# Patient Record
Sex: Male | Born: 1990 | Race: White | Hispanic: Yes | Marital: Single | Smoking: Current every day smoker
Health system: Southern US, Community
[De-identification: ages and names within clinical notes are randomized; demographics above are authoritative.]

---
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
## 2015-09-23 ENCOUNTER — Emergency Department (HOSPITAL_COMMUNITY)
Admission: EM | Admit: 2015-09-23 | Discharge: 2015-09-23 | Disposition: A | Discharge status: Home / Self Care | Payer: Self-pay | Attending: Emergency Medicine | Admitting: Emergency Medicine

## 2015-09-23 ENCOUNTER — Encounter (HOSPITAL_COMMUNITY): Payer: Self-pay | Admitting: *Deleted

## 2015-09-23 ENCOUNTER — Emergency Department (HOSPITAL_COMMUNITY): Payer: Self-pay

## 2015-09-23 DIAGNOSIS — Y998 Other external cause status: Secondary | ICD-10-CM | POA: Insufficient documentation

## 2015-09-23 DIAGNOSIS — Y9366 Activity, soccer: Secondary | ICD-10-CM | POA: Insufficient documentation

## 2015-09-23 DIAGNOSIS — S8991XA Unspecified injury of right lower leg, initial encounter: Secondary | ICD-10-CM | POA: Insufficient documentation

## 2015-09-23 DIAGNOSIS — M25561 Pain in right knee: Secondary | ICD-10-CM

## 2015-09-23 DIAGNOSIS — F17200 Nicotine dependence, unspecified, uncomplicated: Secondary | ICD-10-CM | POA: Insufficient documentation

## 2015-09-23 DIAGNOSIS — Y92322 Soccer field as the place of occurrence of the external cause: Secondary | ICD-10-CM | POA: Insufficient documentation

## 2015-09-23 DIAGNOSIS — X501XXA Overexertion from prolonged static or awkward postures, initial encounter: Secondary | ICD-10-CM | POA: Insufficient documentation

## 2015-09-23 MED ORDER — NAPROXEN 500 MG PO TABS: 500.0000 mg | ORAL_TABLET | Freq: Two times a day (BID) | ORAL | Status: DC

## 2015-09-23 NOTE — Progress Notes (Signed)
Orthopedic Tech Progress Note Patient Details:  Bradley SladeJuan Underwood 04/12/1991 [id number]  Ortho Devices Type of Ortho Device: Crutches Ortho Device/Splint Interventions: Application   Kaysee Hergert 09/23/2015, 8:01 AM

## 2015-09-23 NOTE — ED Notes (Signed)
Pt c/o R knee pain onset yesterday after twisting leg when playing soccer. Pt has knee brace in place

## 2015-09-23 NOTE — Discharge Instructions (Signed)
You have been seen today for knee pain. Your imaging showed no abnormalities. Follow up with orthopedics as soon as possible for reevaluation and chronic management. Follow up with PCP as needed. Return to ED should symptoms worsen.   RESOURCE GUIDE  Chronic Pain Problems: Contact [facility]  [phone] Patients need to be referred by their primary care doctor.  Insufficient Money for Medicine: Contact United Way:  call "211" or [facility] 3213188840.  No Primary Care Doctor: - Call [facility]  [phone] - can help you locate a primary care doctor that  accepts your insurance, provides certain services, etc. - Physician Referral Service- [phone]  Agencies that provide inexpensive medical care: - [facility]  [phone] - [facility]  [phone] - [facility]  [phone] - Women's Clinic  [phone] - Planned Parenthood  [phone] [facility]  [phone]  Medicaid-accepting [facility] Providers: - [facility]- [address]  [phone], Mon-Fri 9am-7pm, Sat 9am-1pm - [facility]- [address] [place], [address] [place]  [phone] - [facility]- [address], [address] [place]  [phone] [facility]- [address]  [phone] - Renaye Rakers- [address] [place], [address], [phone]  Only accepts [place] patients after they have their name  applied to their card  Self Pay (no insurance) in [place]: - Sickle Cell Patients: Dr Willey Blade, [facility]  [address] [place], [phone] - [facility]- [address] [place]  [phone]       [facility] [place]- [address]       -     [facility]- see information above (Speak to [employer] if you do not have insurance)       -  [facility]- [address] [place], [phone]        -  [facility]- [address],  [phone]       -  [facility]- [address] [phone]       -  Dr Julio Sicks-  [address], [place], [phone]       -  [facility]- [address] [phone]       -  [facility]- [address] [phone], also [address], [phone]       -    [facility]- [address] [place], [phone], 1st & 3rd Saturday   every month, 10am-1pm  1) Find a Doctor and Pay Out of Pocket Although you won't have to find out who is covered by your insurance plan, it is a good idea to ask around and get recommendations. You will then need to call the office and see if the doctor you have chosen will accept you as a new patient and what types of options they offer for patients who are self-pay. Some doctors offer discounts or will set up payment plans for their patients who do not have insurance, but you will need to ask so you aren't surprised when you get to your appointment.  2) Contact Your Local Health Department Not all health departments have doctors that can see patients for sick visits, but many do, so it is worth a call to see  if yours does. If you don't know where your local health department is, you can check in your phone book. The CDC also has a tool to help you locate your state's health department, and many state websites also have listings of all of their local health departments.  3) Find a Walk-in Clinic If your illness is not likely to be very severe or complicated, you may want to try a walk in clinic. These are popping up all over the country in pharmacies, drugstores, and shopping centers. They're usually staffed by nurse practitioners or physician assistants that have been trained to treat common illnesses and complaints. They're usually fairly quick and inexpensive. However, if you have serious medical issues or chronic medical problems, these are probably not your  best option  STD Testing - [facility] [place], STD Clinic, [address], [place], phone [phone] or [phone].  Monday - Friday, call for an appointment. [facility]- [place] [facility] of [facility] [place], STD Clinic, [place], [place], phone [phone] or [phone].  Monday - Friday, call for an appointment.  Abuse/Neglect: [facility] [place] Child Abuse Hotline 931-699-6910(336) [phone] [facility] [place] Child Abuse Hotline 325-800-5648(320)471-1137 (After Hours)  Emergency Shelter:  [facility] [phone](336) 6627278902  Maternity Homes: - [facility] [phone](336) [phone] - [facility] (343)[phone](704) [phone]  MRSA Hotline #:   (514) 181-8772(925) 746-7596  [facility] Resources  [facility]  United Way [facility]. [address]                 [address]         [address]  [place]                                               [place]                              [place] Phone:  [phone]                                  Phone:  [phone]                   Phone:  [phone]  [facility], [phone] - [facility] Services - [phone]- [phone]       -     [facility] in [place], [address],                                  [phone], [facility]  [place] Child Abuse Hotline 3672878346(336) (620)696-0158 or 4753478157(336) 8151158193 (After Hours)   Behavioral Health Services  Substance Abuse Resources: - [facility] Drug Services  (848)384-6811209-127-6836 - [facility] [phone] - [facility] (318)[phone])[phone] Floydene Flock- [facility] [phone] - Residential & Outpatient Substance Abuse Program  [phone]  Psychological Services: Tressie Ellis- [facility]  [phone] - [facility]  [phone] - [facility], [phone] [place] [address], [place], ACCESS LINE: (816) [phone] or  (216) [phone], [email or web address]  Dental Assistance  If unable to pay or  uninsured, contact:  [facility] or [facility]. to become qualified for the adult dental clinic.  Patients with Medicaid: [facility] [phone] [address], [phone] [address], [phone]  If unable to pay, or uninsured, contact [facility] (602)490-6067) or [facility] Department [phone]) [phone] in [place], [phone] in [facility]) to become qualified for the adult dental clinic   Other Low-Cost Community Dental Services: - [facility]- [address] [place], [place], [place], [address], [phone], [address]. [address], 2nd and 4th Thursday of the month at 6:30am.  10 clients each day by appointment, can sometimes see walk-in patients if someone does not show for an appointment. [facility]- [address] [place], [place], [address], [phone] - [facility]- [address] [place], [place], [address], [phone] - [facility]- [phone] [facility] Health Department- [phone] [facility] Department- [phone]

## 2015-09-23 NOTE — ED Provider Notes (Signed)
CSN: [id number]     Arrival date & time 09/23/15  [id number] History   First MD Initiated Contact with Patient 09/23/15 [phone]     Chief Complaint  Patient presents with  . Knee Pain     (Consider location/radiation/quality/duration/timing/severity/associated sxs/prior Treatment) HPI   Bradley Underwood is a 25 y.o. male, patient with no pertinent past medical history, presenting to the ED with right knee pain that began yesterday. Pt states he planted his foot and pivoted during a soccer game. Pt states he "may have felt a pop." Pt is able to bear weight on the knee. Pt rates his pain at 8/10, describes it as "just uncomfortable," nonradiating. Pt denies falls or head trauma. Further denies neuro deficits or any other complaints.    History reviewed. No pertinent past medical history. History reviewed. No pertinent past surgical history. No family history on file. Social History  Substance Use Topics  . Smoking status: Current Every Day Smoker  . Smokeless tobacco: None  . Alcohol Use: No    Review of Systems  Musculoskeletal: Positive for arthralgias (right knee).  Skin: Negative for color change and pallor.  Neurological: Negative for weakness and numbness.      Allergies  Review of patient's allergies indicates no known allergies.  Home Medications   Prior to Admission medications   Medication Sig Start Date End Date Taking? Authorizing Provider  naproxen (NAPROSYN) 500 MG tablet Take 1 tablet (500 mg total) by mouth 2 (two) times daily. 09/23/15   Thurlow Gallaga C Lawsyn Heiler, PA-C   BP 151/88 mmHg  Pulse 78  Temp(Src) 97.9 F (36.6 C) (Oral)  Resp 16  SpO2 100% Physical Exam  Constitutional: He appears well-developed and well-nourished. No distress.  HENT:  Head: Normocephalic and atraumatic.  Eyes: Conjunctivae are normal.  Cardiovascular: Normal rate and regular rhythm.   Pulmonary/Chest: Effort normal.  Musculoskeletal:  Tenderness to anterior right knee. No swelling, wounds,  erythema, effusion, laxity, or any other abnormalities. ROM intact.  Neurological: He is alert. He has normal reflexes.  No sensory deficits. Strength 5/5.  Skin: Skin is warm and dry. He is not diaphoretic.  Nursing note and vitals reviewed.   ED Course  Procedures (including critical care time)  Imaging Review Dg Knee Complete 4 Views Right  09/23/2015  CLINICAL DATA:  Twisting injury while playing soccer EXAM: RIGHT KNEE - COMPLETE 4+ VIEW COMPARISON:  None. FINDINGS: Frontal, lateral, and bilateral oblique views were obtained. There is no fracture or dislocation. No joint effusion. The joint spaces appear normal. No erosive change. IMPRESSION: No fracture or joint effusion.  No appreciable arthropathy. Electronically Signed   By: Bretta BangWilliam  Woodruff III M.D.   On: 09/23/2015 07:14   I have personally reviewed and evaluated these images as part of my medical decision-making.   EKG Interpretation None      MDM   Final diagnoses:  Knee pain, acute, right    Bradley Underwood presents with right knee pain from an injury that occurred yesterday.  No abnormality found on the exam that would suggest more serious injury. No neuro deficits. X-ray shows no acute abnormality. Pt already has an adequate knee brace from an injury to his left knee. Given crutches and prescription for naproxen. Advised to follow-up with orthopedics. Return precautions discussed. Patient voiced understanding of these instructions, accepts the plan, and is comfortable with discharge.   Filed Vitals:   09/23/15 0647  BP: 151/88  Pulse: 78  Temp: 97.9 F (36.6 C)  TempSrc:  Oral  Resp: 16  SpO2: 100%     Anselm Pancoast, PA-C 09/24/15 1704  Alvira Monday, MD 09/27/15 1107

## 2015-10-01 ENCOUNTER — Emergency Department (HOSPITAL_COMMUNITY)
Admission: EM | Admit: 2015-10-01 | Discharge: 2015-10-01 | Disposition: A | Discharge status: Home / Self Care | Payer: Medicaid Other | Attending: Emergency Medicine | Admitting: Emergency Medicine

## 2015-10-01 ENCOUNTER — Encounter (HOSPITAL_COMMUNITY): Payer: Self-pay | Admitting: Emergency Medicine

## 2015-10-01 DIAGNOSIS — S8391XD Sprain of unspecified site of right knee, subsequent encounter: Secondary | ICD-10-CM | POA: Insufficient documentation

## 2015-10-01 DIAGNOSIS — Z791 Long term (current) use of non-steroidal anti-inflammatories (NSAID): Secondary | ICD-10-CM | POA: Insufficient documentation

## 2015-10-01 DIAGNOSIS — F17200 Nicotine dependence, unspecified, uncomplicated: Secondary | ICD-10-CM | POA: Insufficient documentation

## 2015-10-01 DIAGNOSIS — X509XXD Other and unspecified overexertion or strenuous movements or postures, subsequent encounter: Secondary | ICD-10-CM | POA: Insufficient documentation

## 2015-10-01 NOTE — Discharge Instructions (Signed)

## 2015-10-01 NOTE — ED Provider Notes (Signed)
CSN: [id number]     Arrival date & time 10/01/15  0031 History   First MD Initiated Contact with Patient 10/01/15 [phone]     Chief Complaint  Patient presents with  . Knee Pain     Patient is a 25 y.o. male presenting with knee pain. The history is provided by the patient. No language interpreter was used.  Knee Pain  Bradley Underwood is a 25 y.o. male who presents to the Emergency Department complaining of knee pain.  He reports 1 week of right knee pain following injuring it while playing soccer. He states he had a twisting injury on a planted foot express immediate pain. He has pain around the entire knee on the anterior aspect. Pain is worse with standing for long periods of time at work and he has to rest every 35-40 minutes. He has some local swelling. He is able to range the knee. Symptoms are mild and constant.  History reviewed. No pertinent past medical history. History reviewed. No pertinent past surgical history. No family history on file. Social History  Substance Use Topics  . Smoking status: Current Every Day Smoker  . Smokeless tobacco: None  . Alcohol Use: No    Review of Systems  All other systems reviewed and are negative.     Allergies  Review of patient's allergies indicates no known allergies.  Home Medications   Prior to Admission medications   Medication Sig Start Date End Date Taking? Authorizing Provider  naproxen (NAPROSYN) 500 MG tablet Take 1 tablet (500 mg total) by mouth 2 (two) times daily. 09/23/15   Shawn C Joy, PA-C   BP 125/71 mmHg  Pulse 75  Temp(Src) 98 F (36.7 C) (Oral)  Resp 18  SpO2 99% Physical Exam  Constitutional: He is oriented to person, place, and time. He appears well-developed and well-nourished. No distress.  HENT:  Head: Normocephalic and atraumatic.  Cardiovascular: Normal rate.   Pulmonary/Chest: Effort normal. No respiratory distress.  Musculoskeletal:  There is mild soft tissue swelling about the right knee. No  ligamentous laxity on exam. Mild diffuse tenderness to palpation around the right knee. 2+ DP pulses. FROM present in the knee.    Neurological: He is alert and oriented to person, place, and time.  5 out of 5 bilateral lower extremity strength  Skin: Skin is warm and dry.  Psychiatric: He has a normal mood and affect. His behavior is normal.  Nursing note and vitals reviewed.   ED Course  Procedures (including critical care time) Labs Review Labs Reviewed - No data to display  Imaging Review No results found. I have personally reviewed and evaluated these images and lab results as part of my medical decision-making.   EKG Interpretation None      MDM   Final diagnoses:  Knee sprain, right, subsequent encounter  Patient here for evaluation of right knee pain following an injury 1 week previously. He had imaging performed that time that was reviewed on this visit. No evidence of acute fracture or dislocation. Discussed with patient and care for knee sprain. Discussed using his knee sleeve that he is at the bedside for pain control as well as anti-inflammatories and rest. Referred to orthopedics for further evaluation of this knee pain.  Tilden FossaElizabeth Dori Devino, MD 10/01/15 2241

## 2015-10-01 NOTE — ED Notes (Signed)
Pt. reports persistent right knee pain onset last week injured while playing soccer , seen here last Monday x-ray done discharged home with prescription Naproxen with no relief.

## 2016-08-26 ENCOUNTER — Emergency Department (HOSPITAL_COMMUNITY)
Admission: EM | Admit: 2016-08-26 | Discharge: 2016-08-26 | Disposition: A | Discharge status: Home / Self Care | Payer: Medicaid Other | Attending: Emergency Medicine | Admitting: Emergency Medicine

## 2016-08-26 ENCOUNTER — Encounter (HOSPITAL_COMMUNITY): Payer: Self-pay | Admitting: *Deleted

## 2016-08-26 DIAGNOSIS — J029 Acute pharyngitis, unspecified: Secondary | ICD-10-CM

## 2016-08-26 DIAGNOSIS — F17200 Nicotine dependence, unspecified, uncomplicated: Secondary | ICD-10-CM | POA: Insufficient documentation

## 2016-08-26 DIAGNOSIS — K122 Cellulitis and abscess of mouth: Secondary | ICD-10-CM | POA: Insufficient documentation

## 2016-08-26 LAB — RAPID STREP SCREEN (MED CTR MEBANE ONLY): Streptococcus, Group A Screen (Direct): NEGATIVE

## 2016-08-26 MED ORDER — OXYCODONE-ACETAMINOPHEN 5-325 MG PO TABS
1.0000 | ORAL_TABLET | Freq: Once | ORAL | Status: AC
  Administered 2016-08-26: 1 via ORAL
  Filled 2016-08-26: qty 1

## 2016-08-26 MED ORDER — DEXAMETHASONE 4 MG PO TABS
12.0000 mg | ORAL_TABLET | Freq: Once | ORAL | Status: AC
  Administered 2016-08-26: 12 mg via ORAL
  Filled 2016-08-26: qty 3

## 2016-08-26 MED ORDER — KETOROLAC TROMETHAMINE 15 MG/ML IJ SOLN
30.0000 mg | Freq: Once | INTRAMUSCULAR | Status: AC
  Administered 2016-08-26: 30 mg via INTRAMUSCULAR
  Filled 2016-08-26: qty 2

## 2016-08-26 NOTE — ED Triage Notes (Signed)
The pt is c/o a sore throat for 2 days he thinks he has had a temp

## 2016-08-26 NOTE — ED Provider Notes (Signed)
[facility]PT Provider Note    By signing my name below, I, Rosario Adie, attest that this documentation has been prepared under the direction and in the presence of Raeford Razor, MD. Electronically Signed: Rosario Adie, ED Scribe. 08/26/16. 7:46 PM.  History   Chief Complaint Chief Complaint  Patient presents with  . Sore Throat   The history is provided by the patient and medical records. No language interpreter was used.    HPI Comments: Bradley Underwood is a 26 y.o. male with no pertinent PMHx, who presents to the Emergency Department complaining of persistent, worsening sore throat beginning two days ago. He notes associated fever, left-sided ear pain, and dysphagia secondary to his sore throat. Pt is able to tolerate their own secretions, but pain is exacerbated w/ swallowing. He states that he has been taking Mucinex at home with temporary relief of his fever, but not his sore throat. No recent prolonged fume inhalation/exposure. Pt denies cough, or any other associated symptoms.   History reviewed. No pertinent past medical history.  There are no active problems to display for this patient.  History reviewed. No pertinent surgical history.  Home Medications    Prior to Admission medications   Medication Sig Start Date End Date Taking? Authorizing Provider  naproxen (NAPROSYN) 500 MG tablet Take 1 tablet (500 mg total) by mouth 2 (two) times daily. 09/23/15   Anselm Pancoast, PA-C   Family History No family history on file.  Social History Social History  Substance Use Topics  . Smoking status: Current Every Day Smoker  . Smokeless tobacco: Never Used  . Alcohol use No   Allergies   Patient has no known allergies.  Review of Systems Review of Systems  Constitutional: Positive for fever.  HENT: Positive for ear pain (left) and sore throat.        Positive for dysphagia.   Respiratory: Negative for cough.   All other systems reviewed and are  negative.  Physical Exam Updated Vital Signs BP (!) 143/87 (BP Location: Right Arm)   Pulse (!) 107   Temp 99.5 F (37.5 C) (Oral)   Resp 18   SpO2 99%   Physical Exam  Constitutional: He is oriented to person, place, and time. He appears well-developed and well-nourished.  HENT:  Head: Normocephalic and atraumatic.  Right Ear: External ear normal.  Left Ear: External ear normal.  Nose: Nose normal.  Pharyngitis is noted. Uvula is enlarged and edematous, but appears midline. Normal sounding voice. Neck supple. TMs are normal bilaterally.   Eyes: Conjunctivae are normal. Right eye exhibits no discharge. Left eye exhibits no discharge.  Neck: Normal range of motion.  Cardiovascular: Normal rate, regular rhythm and normal heart sounds.   No murmur heard. Pulmonary/Chest: Effort normal and breath sounds normal. No respiratory distress. He has no wheezes. He has no rales.  Abdominal: Soft. There is no tenderness. There is no rebound and no guarding.  Musculoskeletal: Normal range of motion. He exhibits no edema or tenderness.  Neurological: He is alert and oriented to person, place, and time. No cranial nerve deficit. Coordination normal.  Skin: Skin is warm and dry. No rash noted. No erythema. No pallor.  Psychiatric: He has a normal mood and affect. His behavior is normal.  Nursing note and vitals reviewed.  ED Treatments / Results  DIAGNOSTIC STUDIES: Oxygen Saturation is 99% on RA, normal by my interpretation.   COORDINATION OF CARE: 7:46 PM-Discussed next steps with pt. Pt verbalized understanding  and is agreeable with the plan.   Labs (all labs ordered are listed, but only abnormal results are displayed) Labs Reviewed  RAPID STREP SCREEN (NOT AT [facility])  CULTURE, GROUP A STREP [facility])   EKG  EKG Interpretation None      Radiology No results found.  Procedures Procedures   Medications Ordered in ED Medications - No data to display  Initial Impression /  Assessment and Plan / ED Course  I have reviewed the triage vital signs and the nursing notes.  Pertinent labs & imaging results that were available during my care of the patient were reviewed by me and considered in my medical decision making (see chart for details).   Final Clinical Impressions(s) / ED Diagnoses   Final diagnoses:  Pharyngitis, unspecified etiology  Uvulitis   New Prescriptions New Prescriptions   No medications on file   I personally preformed the services scribed in my presence. The recorded information has been reviewed is accurate. Raeford Razor, MD.        Raeford Razor, MD 09/03/16 [phone]

## 2016-08-27 ENCOUNTER — Emergency Department (HOSPITAL_COMMUNITY)
Admission: EM | Admit: 2016-08-27 | Discharge: 2016-08-27 | Disposition: A | Discharge status: Home / Self Care | Payer: Self-pay | Attending: Emergency Medicine | Admitting: Emergency Medicine

## 2016-08-27 ENCOUNTER — Emergency Department (HOSPITAL_COMMUNITY): Payer: Self-pay

## 2016-08-27 ENCOUNTER — Encounter (HOSPITAL_COMMUNITY): Payer: Self-pay | Admitting: *Deleted

## 2016-08-27 DIAGNOSIS — F17200 Nicotine dependence, unspecified, uncomplicated: Secondary | ICD-10-CM | POA: Insufficient documentation

## 2016-08-27 DIAGNOSIS — J36 Peritonsillar abscess: Secondary | ICD-10-CM | POA: Insufficient documentation

## 2016-08-27 LAB — CBC WITH DIFFERENTIAL/PLATELET
BASOS ABS: 0 10*3/uL (ref 0.0–0.1)
BASOS PCT: 0 %
EOS ABS: 0 10*3/uL (ref 0.0–0.7)
EOS PCT: 0 %
HCT: 42.1 % (ref 39.0–52.0)
HEMOGLOBIN: 15 g/dL (ref 13.0–17.0)
Lymphocytes Relative: 11 %
Lymphs Abs: 2.2 10*3/uL (ref 0.7–4.0)
MCH: 29.1 pg (ref 26.0–34.0)
MCHC: 35.6 g/dL (ref 30.0–36.0)
MCV: 81.6 fL (ref 78.0–100.0)
MONO ABS: 2.1 10*3/uL — AB (ref 0.1–1.0)
Monocytes Relative: 11 %
Neutro Abs: 16.1 10*3/uL — ABNORMAL HIGH (ref 1.7–7.7)
Neutrophils Relative %: 78 %
PLATELETS: 252 10*3/uL (ref 150–400)
RBC: 5.16 MIL/uL (ref 4.22–5.81)
RDW: 12.3 % (ref 11.5–15.5)
WBC: 20.4 10*3/uL — AB (ref 4.0–10.5)

## 2016-08-27 LAB — BASIC METABOLIC PANEL
ANION GAP: 11 (ref 5–15)
BUN: 14 mg/dL (ref 6–20)
CO2: 23 mmol/L (ref 22–32)
Calcium: 9.7 mg/dL (ref 8.9–10.3)
Chloride: 103 mmol/L (ref 101–111)
Creatinine, Ser: 0.73 mg/dL (ref 0.61–1.24)
GFR calc Af Amer: >60 mL/min (ref >60)
GLUCOSE: 98 mg/dL (ref 65–99)
POTASSIUM: 3.7 mmol/L (ref 3.5–5.1)
Sodium: 137 mmol/L (ref 135–145)

## 2016-08-27 LAB — MONONUCLEOSIS SCREEN: MONO SCREEN: NEGATIVE

## 2016-08-27 MED ORDER — MORPHINE SULFATE (PF) 4 MG/ML IV SOLN
4.0000 mg | Freq: Once | INTRAVENOUS | Status: AC
  Administered 2016-08-27: 4 mg via INTRAVENOUS
  Filled 2016-08-27: qty 1

## 2016-08-27 MED ORDER — CLINDAMYCIN PHOSPHATE 600 MG/50ML IV SOLN
600.0000 mg | Freq: Once | INTRAVENOUS | Status: AC
  Administered 2016-08-27: 600 mg via INTRAVENOUS
  Filled 2016-08-27: qty 50

## 2016-08-27 MED ORDER — HYDROCODONE-ACETAMINOPHEN 5-325 MG PO TABS
1.0000 | ORAL_TABLET | Freq: Once | ORAL | Status: AC
  Administered 2016-08-27: 1 via ORAL
  Filled 2016-08-27: qty 1

## 2016-08-27 MED ORDER — IOPAMIDOL (ISOVUE-300) INJECTION 61%
INTRAVENOUS | Status: AC
  Administered 2016-08-27: 75 mL
  Filled 2016-08-27: qty 75

## 2016-08-27 MED ORDER — CLINDAMYCIN HCL 300 MG PO CAPS: 300.0000 mg | ORAL_CAPSULE | Freq: Four times a day (QID) | ORAL | 0 refills | Status: DC

## 2016-08-27 MED ORDER — HYDROCODONE-ACETAMINOPHEN 5-325 MG PO TABS: 1.0000 | ORAL_TABLET | ORAL | 0 refills | Status: DC | PRN

## 2016-08-27 NOTE — ED Provider Notes (Signed)
[facility]EMERGENCY DEPT Provider Note   CSN: [id number] Arrival date & time: 08/27/16  1407  By signing my name below, I, Bradley Underwood, attest that this documentation has been prepared under the direction and in the presence of Bradley Pander, MD. Electronically Signed: Cynda Underwood, Scribe. 08/27/16. 2:39 PM.  History   Chief Complaint Chief Complaint  Patient presents with  . Sore Throat    HPI Comments: Bradley Underwood is a 26 y.o. male with no pertinent medical history, who presents to the Emergency Department complaining of a persistent, gradually worsening sore throat that began 3 days ago. Patient was seen here yesterday and rapid strep was negative and he received decadron and toradol, and he was noted to have mild bilateral tonsillar swelling. Patient reports progressively worsening left sided neck swelling. Patient reports associated increased tonsillar swelling, trouble swallowing, left ear pain radiating to the left sided face, subjective fever. Patient is able to tolerate secretions and liquids. Patient denies any nausea, vomiting, congestion, or any other symptoms.   The history is provided by the patient. No language interpreter was used.    History reviewed. No pertinent past medical history.  There are no active problems to display for this patient.   History reviewed. No pertinent surgical history.     Home Medications    Prior to Admission medications   Medication Sig Start Date End Date Taking? Authorizing Provider  naproxen (NAPROSYN) 500 MG tablet Take 1 tablet (500 mg total) by mouth 2 (two) times daily. 09/23/15   Anselm Pancoast, PA-C    Family History No family history on file.  Social History Social History  Substance Use Topics  . Smoking status: Current Every Day Smoker  . Smokeless tobacco: Never Used  . Alcohol use No     Allergies   Patient has no known allergies.   Review of Systems Review of Systems  Constitutional: Positive for  fever (subjective ). Negative for chills.  HENT: Positive for ear pain (left), sore throat and trouble swallowing. Negative for congestion and facial swelling.   Gastrointestinal: Negative for nausea and vomiting.  All other systems reviewed and are negative.    Physical Exam Updated Vital Signs BP (!) 143/88   Pulse 97   Temp 99.1 F (37.3 C) (Oral)   Resp 18   Ht  (1.676 m)   Wt 190 lb (86.2 kg)   SpO2 99%   BMI 30.67 kg/m   Physical Exam  Constitutional: He is oriented to person, place, and time. He appears well-developed and well-nourished.  HENT:  Head: Normocephalic.  Right Ear: External ear normal.  Left Ear: External ear normal.  Mouth/Throat: No oropharyngeal exudate.  Tonsillar swelling bilaterally, left greater than right. Uvula midline. Bilaterally effusions behind both ears. No obvious otitis media bilaterally.   Eyes: EOM are normal.  Neck: Normal range of motion. Neck supple. No tracheal deviation present.  L sided cervical LAD   Cardiovascular: Normal rate.   Pulmonary/Chest: Effort normal. No stridor.  Abdominal: He exhibits no distension.  Musculoskeletal: Normal range of motion.  Lymphadenopathy:    He has cervical adenopathy.  Neurological: He is alert and oriented to person, place, and time.  Psychiatric: He has a normal mood and affect.  Nursing note and vitals reviewed.    ED Treatments / Results  DIAGNOSTIC STUDIES: Oxygen Saturation is 98% on RA, normal by my interpretation.    COORDINATION OF CARE: 2:39 PM Discussed treatment plan with pt at bedside and pt  agreed to plan, which includes IV antibiotics, Mono test, and a CT.   Labs (all labs ordered are listed, but only abnormal results are displayed) Labs Reviewed  CBC WITH DIFFERENTIAL/PLATELET - Abnormal; Notable for the following:       Result Value   WBC 20.4 (*)    Neutro Abs 16.1 (*)    Monocytes Absolute 2.1 (*)    All other components within normal limits  BASIC  METABOLIC PANEL  MONONUCLEOSIS SCREEN    EKG  EKG Interpretation None       Radiology Ct Soft Tissue Neck W Contrast  Result Date: 08/27/2016 CLINICAL DATA:  LEFT neck pain and swelling with dysphagia for 2-3 days. Evaluate for peritonsillar abscess. EXAM: CT NECK WITH CONTRAST TECHNIQUE: Multidetector CT imaging of the neck was performed using the standard protocol following the bolus administration of intravenous contrast. CONTRAST:  75mL ISOVUE-300 IOPAMIDOL (ISOVUE-300) INJECTION 61% COMPARISON:  None. FINDINGS: PHARYNX AND LARYNX: Irregular 2.7 x 2 x 3 cm (transverse by AP by CC) rim enhancing fluid collection LEFT pharynx consistent with peritonsillar abscess with edema extending to the mucosal surface. Effacement LEFT parapharyngeal fat planes. Extensive surrounding edema extending to the LEFT supraglottic fat, LEFT glossopharyngeal fold, LEFT aryepiglottic fold. Effacement LEFT piriform sinus and LEFT vallecula. Airway is patent. Small retropharyngeal effusion. Normal larynx. SALIVARY GLANDS: Normal. THYROID: Normal. LYMPH NODES: No lymphadenopathy by CT size criteria. Scattered small reactive lymph nodes. VASCULAR: Normal. LIMITED INTRACRANIAL: Normal. VISUALIZED ORBITS: Normal. MASTOIDS AND VISUALIZED PARANASAL SINUSES: Trace paranasal sinus mucosal thickening. Mastoid air cells are well aerated. SKELETON: Nonacute. UPPER CHEST: Lung apices are clear. No superior mediastinal lymphadenopathy. OTHER: None. IMPRESSION: 2.7 x 2 x 3 cm LEFT peritonsillar abscess. Severe LEFT pharyngitis. Patent airway. Acute findings discussed with and reconfirmed by Dr.Jashawn Floyd on 08/27/2016 at 5:26 pm. Electronically Signed   By: Bradley Underwood M.D.   On: 08/27/2016 17:29    Procedures Procedures (including critical care time)  Medications Ordered in ED Medications  HYDROcodone-acetaminophen (NORCO/VICODIN) 5-325 MG per tablet 1 tablet (not administered)  clindamycin (CLEOCIN) IVPB 600 mg (0 mg  Intravenous Stopped 08/27/16 1547)  morphine 4 MG/ML injection 4 mg (4 mg Intravenous Given 08/27/16 1452)  iopamidol (ISOVUE-300) 61 % injection (75 mLs  Contrast Given 08/27/16 1659)     Initial Impression / Assessment and Plan / ED Course  I have reviewed the triage vital signs and the nursing notes.  Pertinent labs & imaging results that were available during my care of the patient were reviewed by me and considered in my medical decision making (see chart for details).     Gonzalo Waymire is a 26 y.o. male here with worsening sore throat. Left tonsil slightly swollen but still able to tolerate secretions and open his mouth. Uvula still midline. I considered bad tonsillitis vs early PTA. Will get labs, CT neck.   5:51 PM Labs showed WBC 20. CT showed L 2 x 3 x 2 cm PTA. Airway patent. I called Dr. Pollyann Kennedy from ENT. He reviewed the patient's CT and labs. He prefers to see patient in the office tomorrow morning and perform I and D in the office. He wants me to send patient home with clinda. Received clinda IV in the ED. Able to tolerate PO vicodin. Will dc home. Told him to call office and see Dr. Pollyann Kennedy tomorrow. He will return tonight he can't tolerate secretion, unable to swallow, trouble breathing.   Final Clinical Impressions(s) / ED Diagnoses   Final  diagnoses:  None    New Prescriptions New Prescriptions   No medications on file   I personally performed the services described in this documentation, which was scribed in my presence. The recorded information has been reviewed and is accurate.    Bradley Pander, MD 08/27/16 1754

## 2016-08-27 NOTE — Discharge Instructions (Signed)
Take motrin for pain.   Take vicodin for severe pain. Do NOT drive with it.   Take clindamycin as prescribed.   I don't expect you to eat much today. Stay hydrated with liquids, nothing after midnight. Dr. Pollyann Kennedy will see you in the morning to drain the abscess.   Call Dr. Lucky Rathke office tomorrow to make sure you have an appointment   Return to ER tonight if you are unable to swallow anything, trouble breathing, unable to tolerate your secretions.

## 2016-08-27 NOTE — ED Triage Notes (Signed)
Pt is here with tonsil swelling and was seen here yesterday.  Pt has some greater swelling to left tonsil area and he states he cannot swallow anything.  Face appears a little red.  Airway intact and no distress or drooling

## 2016-08-28 ENCOUNTER — Encounter (HOSPITAL_COMMUNITY): Payer: Self-pay | Admitting: *Deleted

## 2016-08-28 ENCOUNTER — Emergency Department (HOSPITAL_COMMUNITY)
Admission: EM | Admit: 2016-08-28 | Discharge: 2016-08-28 | Disposition: A | Discharge status: Home / Self Care | Payer: Medicaid Other | Attending: Emergency Medicine | Admitting: Emergency Medicine

## 2016-08-28 DIAGNOSIS — Z87891 Personal history of nicotine dependence: Secondary | ICD-10-CM | POA: Insufficient documentation

## 2016-08-28 DIAGNOSIS — J36 Peritonsillar abscess: Secondary | ICD-10-CM

## 2016-08-28 MED ORDER — CLINDAMYCIN PHOSPHATE 900 MG/50ML IV SOLN
900.0000 mg | Freq: Once | INTRAVENOUS | Status: AC
  Administered 2016-08-28: 900 mg via INTRAVENOUS
  Filled 2016-08-28: qty 50

## 2016-08-28 MED ORDER — DEXAMETHASONE SODIUM PHOSPHATE 10 MG/ML IJ SOLN
20.0000 mg | Freq: Once | INTRAMUSCULAR | Status: AC
  Administered 2016-08-28: 20 mg via INTRAVENOUS
  Filled 2016-08-28: qty 2

## 2016-08-28 NOTE — ED Notes (Signed)
ENT Cart at bedside. 

## 2016-08-28 NOTE — Consult Note (Signed)
Reason for Consult: Peritonsillar abscess Referring Physician: ER  Bradley Underwood is an 26 y.o. male.  HPI: 26 year old male with a few days of left-sided throat pain.  He came to the ER yesterday and CT imaging demonstrated a left peritonsillar abscess.  He was treated with IV clindamycin and discharged on oral clindamycin.  He was going to follow-up in our office today.  Last night, he had an episode of hematemesis after noticing a bad taste in his mouth.  Today, he returned to the ER due to swelling of his uvula.  His throat has been hurting less today.  History reviewed. No pertinent past medical history.  History reviewed. No pertinent surgical history.  No family history on file.  Social History:  reports that he has quit smoking. He has never used smokeless tobacco. He reports that he does not drink alcohol or use drugs.  Allergies: No Known Allergies  Medications: I have reviewed the patient's current medications.  Results for orders placed or performed during the hospital encounter of 08/27/16 (from the past 48 hour(s))  CBC with Differential/Platelet     Status: Abnormal   Collection Time: 08/27/16  2:57 PM  Result Value Ref Range   WBC 20.4 (H) 4.0 - 10.5 K/uL   RBC 5.16 4.22 - 5.81 MIL/uL   Hemoglobin 15.0 13.0 - 17.0 g/dL   HCT 42.1 39.0 - 52.0 %   MCV 81.6 78.0 - 100.0 fL   MCH 29.1 26.0 - 34.0 pg   MCHC 35.6 30.0 - 36.0 g/dL   RDW 12.3 11.5 - 15.5 %   Platelets 252 150 - 400 K/uL   Neutrophils Relative % 78 %   Neutro Abs 16.1 (H) 1.7 - 7.7 K/uL   Lymphocytes Relative 11 %   Lymphs Abs 2.2 0.7 - 4.0 K/uL   Monocytes Relative 11 %   Monocytes Absolute 2.1 (H) 0.1 - 1.0 K/uL   Eosinophils Relative 0 %   Eosinophils Absolute 0.0 0.0 - 0.7 K/uL   Basophils Relative 0 %   Basophils Absolute 0.0 0.0 - 0.1 K/uL  Basic metabolic panel     Status: None   Collection Time: 08/27/16  2:57 PM  Result Value Ref Range   Sodium 137 135 - 145 mmol/L   Potassium 3.7 3.5 -  5.1 mmol/L   Chloride 103 101 - 111 mmol/L   CO2 23 22 - 32 mmol/L   Glucose, Bld 98 65 - 99 mg/dL   BUN 14 6 - 20 mg/dL   Creatinine, Ser 0.73 0.61 - 1.24 mg/dL   Calcium 9.7 8.9 - 10.3 mg/dL   GFR calc non Af Amer >60 >60 mL/min   GFR calc Af Amer >60 >60 mL/min    Comment: (NOTE) The eGFR has been calculated using the CKD EPI equation. This calculation has not been validated in all clinical situations. eGFR's persistently <60 mL/min signify possible Chronic Kidney Disease.    Anion gap 11 5 - 15  Mononucleosis screen     Status: None   Collection Time: 08/27/16  2:57 PM  Result Value Ref Range   Mono Screen NEGATIVE NEGATIVE    Ct Soft Tissue Neck W Contrast  Result Date: 08/27/2016 CLINICAL DATA:  LEFT neck pain and swelling with dysphagia for 2-3 days. Evaluate for peritonsillar abscess. EXAM: CT NECK WITH CONTRAST TECHNIQUE: Multidetector CT imaging of the neck was performed using the standard protocol following the bolus administration of intravenous contrast. CONTRAST:  15m ISOVUE-300 IOPAMIDOL (ISOVUE-300)  INJECTION 61% COMPARISON:  None. FINDINGS: PHARYNX AND LARYNX: Irregular 2.7 x 2 x 3 cm (transverse by AP by CC) rim enhancing fluid collection LEFT pharynx consistent with peritonsillar abscess with edema extending to the mucosal surface. Effacement LEFT parapharyngeal fat planes. Extensive surrounding edema extending to the LEFT supraglottic fat, LEFT glossopharyngeal fold, LEFT aryepiglottic fold. Effacement LEFT piriform sinus and LEFT vallecula. Airway is patent. Small retropharyngeal effusion. Normal larynx. SALIVARY GLANDS: Normal. THYROID: Normal. LYMPH NODES: No lymphadenopathy by CT size criteria. Scattered small reactive lymph nodes. VASCULAR: Normal. LIMITED INTRACRANIAL: Normal. VISUALIZED ORBITS: Normal. MASTOIDS AND VISUALIZED PARANASAL SINUSES: Trace paranasal sinus mucosal thickening. Mastoid air cells are well aerated. SKELETON: Nonacute. UPPER CHEST: Lung apices  are clear. No superior mediastinal lymphadenopathy. OTHER: None. IMPRESSION: 2.7 x 2 x 3 cm LEFT peritonsillar abscess. Severe LEFT pharyngitis. Patent airway. Acute findings discussed with and reconfirmed by Dr.DAVID YAO on 08/27/2016 at 5:26 pm. Electronically Signed   By: Elon Alas M.D.   On: 08/27/2016 17:29    Review of Systems  HENT: Positive for sore throat.   All other systems reviewed and are negative.  Blood pressure (!) 142/89, pulse (!) 102, temperature 98.2 F (36.8 C), temperature source Oral, resp. rate 16, SpO2 98 %. Physical Exam  Constitutional: He is oriented to person, place, and time. He appears well-developed and well-nourished. No distress.  HENT:  Head: Normocephalic and atraumatic.  Right Ear: External ear normal.  Left Ear: External ear normal.  Nose: Nose normal.  Left peritonsillar region somewhat full, uvula midline.  No significant trismus.  Eyes: Conjunctivae and EOM are normal. Pupils are equal, round, and reactive to light.  Neck: Normal range of motion. Neck supple.  Cardiovascular: Normal rate.   Respiratory: Effort normal.  Musculoskeletal: Normal range of motion.  Neurological: He is alert and oriented to person, place, and time. No cranial nerve deficit.  Skin: Skin is warm and dry.  Psychiatric: He has a normal mood and affect. His behavior is normal. Judgment and thought content normal.    Assessment/Plan: Left peritonsillar abscess I personally reviewed his neck CT from 4/12 that demonstrated a 3 cm left peritonsillar abscess.  I recommended and performed incision and drainage of the left peritonsillar region but did not encounter an abscess cavity.  More than likely, the pus drained last night with his episode of hematemesis and foul taste in his mouth.  He was instructed to complete his course of clindamycin and I will have him follow-up late next week.  Bradley Underwood 08/28/2016, 5:39 PM

## 2016-08-28 NOTE — Procedures (Signed)
Preop diagnosis: Left peritonsillar abscess Postop diagnosis: same Procedure: Incision and drainage of left peritonsillar abscess Surgeon: Jenne Pane Anesth: Topical with cetacaine and local with 1% lidocaine with 1:100,000 epinephrine Compl: None Findings: No pus was encountered Description of procedure: After discussing risks, benefits, and alternatives, the patient was placed in a sitting position.  The oropharynx was sprayed on the left side with cetacaine twice.  The mucosa was then injected with local anesthetic.  An angled incision was made above the left tonsil using a 15 blade scalpel.  With no purulent drainage, a tonsil clamp was used to dissect inferior into the peritonsillar plane and still no pus was encountered.  An 18 gauge needle on a syringe was used to aspirate in a few different directions and still no pus was encountered.  He tolerated the procedure well and was returned to nursing care in stable condition.

## 2016-08-28 NOTE — ED Triage Notes (Signed)
Pt was given pills for sore throat 2 days ago.  Now pain is gone, but epiglottis is swollen.  Airway patent.

## 2016-08-28 NOTE — Discharge Instructions (Signed)
Please finish all of your current antibiotics (Clindamycin). Please call Dr. Lamount Cranker office to schedule an appointment for follow-up on 09/04/16. Please return to the Emergency Department if you develop new symptoms, such a fever, or if you current symptoms worsen.

## 2016-08-28 NOTE — ED Notes (Signed)
Dr. Jenne Pane ENT at bedside to perform drainage

## 2016-08-28 NOTE — ED Notes (Signed)
Papers reviewed and patient verbalizes understanding and denies questions.

## 2016-08-28 NOTE — ED Provider Notes (Signed)
[facility] Provider Note   CSN: [id number] Arrival date & time: 08/28/16  1019  By signing my name below, I, Orpah Cobb, attest that this documentation has been prepared under the direction and in the presence of Rudolph Dobler, PA-C. Electronically Signed: Orpah Cobb , ED Scribe. 08/28/16. 4:15 PM.   History   Chief Complaint Chief Complaint  Patient presents with  . Sore Throat    HPI Bradley Underwood is a 26 y.o. male who presents to the Emergency Department complaining of sore throat with onset x2 days. Pt was seen yesterday and given IV antibiotics for sore throat that he states provided relief. He states that he had an episode of hematemesis and now he feels less sore throat but states that his uvula is swollen. He reports sore throat. He denies any modifying factors. Pt denies ear pain, rhinorrhea, difficulty swallowing, rash, SOB. Of note, pt had an appointment today with ENT but states that he does not have his insurance card.      The history is provided by the patient. No language interpreter was used.    History reviewed. No pertinent past medical history.  There are no active problems to display for this patient.   History reviewed. No pertinent surgical history.     Home Medications    Prior to Admission medications   Medication Sig Start Date End Date Taking? Authorizing Provider  clindamycin (CLEOCIN) 300 MG capsule Take 1 capsule (300 mg total) by mouth 4 (four) times daily. X 7 days 08/27/16   [name], MD  HYDROcodone-acetaminophen (NORCO/VICODIN) 5-325 MG tablet Take 1-2 tablets by mouth every 4 (four) hours as needed. 08/27/16   [name], MD  naproxen (NAPROSYN) 500 MG tablet Take 1 tablet (500 mg total) by mouth 2 (two) times daily. 09/23/15   Anselm Pancoast, PA-C    Family History No family history on file.  Social History Social History  Substance Use Topics  . Smoking status: Former Games developer  . Smokeless tobacco:  Never Used  . Alcohol use No     Allergies   Patient has no known allergies.   Review of Systems Review of Systems  Constitutional: Negative for fever.  HENT: Positive for sore throat. Negative for ear pain, rhinorrhea and trouble swallowing.   Respiratory: Negative for shortness of breath.   Skin: Negative for rash.     Physical Exam Updated Vital Signs BP (!) 142/89 (BP Location: Right Arm)   Pulse (!) 102   Temp 98.2 F (36.8 C) (Oral)   Resp 16   SpO2 98%   Physical Exam  Constitutional: He appears well-developed and well-nourished.  HENT:  Head: Normocephalic and atraumatic.  Mouth/Throat: Uvula swelling present. No oropharyngeal exudate.  Erythema and swelling of the uvula (but uvula is midline), his tonsils are swollen bilaterally but no exudate, no signs of airway compromise.  Eyes: Conjunctivae are normal.  Neck: Neck supple.  Cardiovascular: Normal rate and regular rhythm.   No murmur heard. Pulmonary/Chest: Effort normal and breath sounds normal. No respiratory distress.  Abdominal: Soft. There is no tenderness.  Musculoskeletal: He exhibits no edema.  Neurological: He is alert.  Skin: Skin is warm and dry.  Psychiatric: He has a normal mood and affect.  Nursing note and vitals reviewed.    ED Treatments / Results   DIAGNOSTIC STUDIES: Oxygen Saturation is 98% on RA, normal by my interpretation.   COORDINATION OF CARE: 4:15 PM-Discussed next steps with pt. Pt verbalized understanding  and is agreeable with the plan.    Labs (all labs ordered are listed, but only abnormal results are displayed) Labs Reviewed - No data to display  EKG  EKG Interpretation None       Radiology Ct Soft Tissue Neck W Contrast  Result Date: 08/27/2016 CLINICAL DATA:  LEFT neck pain and swelling with dysphagia for 2-3 days. Evaluate for peritonsillar abscess. EXAM: CT NECK WITH CONTRAST TECHNIQUE: Multidetector CT imaging of the neck was performed using the  standard protocol following the bolus administration of intravenous contrast. CONTRAST:  75mL ISOVUE-300 IOPAMIDOL (ISOVUE-300) INJECTION 61% COMPARISON:  None. FINDINGS: PHARYNX AND LARYNX: Irregular 2.7 x 2 x 3 cm (transverse by AP by CC) rim enhancing fluid collection LEFT pharynx consistent with peritonsillar abscess with edema extending to the mucosal surface. Effacement LEFT parapharyngeal fat planes. Extensive surrounding edema extending to the LEFT supraglottic fat, LEFT glossopharyngeal fold, LEFT aryepiglottic fold. Effacement LEFT piriform sinus and LEFT vallecula. Airway is patent. Small retropharyngeal effusion. Normal larynx. SALIVARY GLANDS: Normal. THYROID: Normal. LYMPH NODES: No lymphadenopathy by CT size criteria. Scattered small reactive lymph nodes. VASCULAR: Normal. LIMITED INTRACRANIAL: Normal. VISUALIZED ORBITS: Normal. MASTOIDS AND VISUALIZED PARANASAL SINUSES: Trace paranasal sinus mucosal thickening. Mastoid air cells are well aerated. SKELETON: Nonacute. UPPER CHEST: Lung apices are clear. No superior mediastinal lymphadenopathy. OTHER: None. IMPRESSION: 2.7 x 2 x 3 cm LEFT peritonsillar abscess. Severe LEFT pharyngitis. Patent airway. Acute findings discussed with and reconfirmed by Dr.DAVID YAO on 08/27/2016 at 5:26 pm. Electronically Signed   By: Awilda Metro M.D.   On: 08/27/2016 17:29    Procedures Procedures (including critical care time)  Medications Ordered in ED Medications  dexamethasone (DECADRON) injection 20 mg (20 mg Intravenous Given 08/28/16 1235)  clindamycin (CLEOCIN) IVPB 900 mg (900 mg Intravenous New Bag/Given 08/28/16 1236)     Initial Impression / Assessment and Plan / ED Course  I have reviewed the triage vital signs and the nursing notes.  Pertinent labs & imaging results that were available during my care of the patient were reviewed by me and considered in my medical decision making (see chart for details).     Patient's history and  symptoms concerning for peritonsillar abscess that has not been adequately treated. Patient was seen yesterday and CT showed 2 x 3 x 2 PTA. Given clinda IV in the ED and given rx for clinda. He took 1 dose yesterday but vomited immediately afterwards. He was told to follow up with ENT today for I&D as preferred by Dr. Pollyann Kennedy. However, patient returns today for swollen uvula but decreased pain. He did not go to ENT because he did not have his insurance card. Did not take clinda today. Patient appears to be noncompliant with seeing ENT. Although I told him that he needs to be seen in order to prevent any worsening infection or airway compromise, he still did not explicitly state that he would go. Dr. Jenne Pane, ENT was updated on the patient's condition and informed that he would come here to see him. Given IV clinda and decadron here. Patient still appears NAD and nontoxic, with no signs of airway compromise and can tolerate secretions. Dr. Jenne Pane states will be a few hours before he is able to come in. Care turned over to [facility] PA-C at end of shift.   Final Clinical Impressions(s) / ED Diagnoses   Final diagnoses:  Peritonsillar abscess    New Prescriptions New Prescriptions   No medications on file   I  personally performed the services described in this documentation, which was scribed in my presence. The recorded information has been reviewed and is accurate.     Dietrich Pates, PA-C 08/28/16 1650    Tilden Fossa, MD 08/29/16 [phone]

## 2016-08-29 LAB — CULTURE, GROUP A STREP (THRC)

## 2017-05-17 ENCOUNTER — Emergency Department (HOSPITAL_BASED_OUTPATIENT_CLINIC_OR_DEPARTMENT_OTHER)
Admission: EM | Admit: 2017-05-17 | Discharge: 2017-05-17 | Disposition: A | Discharge status: Home / Self Care | Payer: Medicaid Other | Attending: Physician Assistant | Admitting: Physician Assistant

## 2017-05-17 ENCOUNTER — Emergency Department (HOSPITAL_BASED_OUTPATIENT_CLINIC_OR_DEPARTMENT_OTHER): Payer: Medicaid Other

## 2017-05-17 ENCOUNTER — Other Ambulatory Visit: Payer: Self-pay

## 2017-05-17 ENCOUNTER — Encounter (HOSPITAL_BASED_OUTPATIENT_CLINIC_OR_DEPARTMENT_OTHER): Payer: Self-pay | Admitting: *Deleted

## 2017-05-17 DIAGNOSIS — S6991XA Unspecified injury of right wrist, hand and finger(s), initial encounter: Secondary | ICD-10-CM | POA: Insufficient documentation

## 2017-05-17 DIAGNOSIS — Y9389 Activity, other specified: Secondary | ICD-10-CM | POA: Insufficient documentation

## 2017-05-17 DIAGNOSIS — W208XXA Other cause of strike by thrown, projected or falling object, initial encounter: Secondary | ICD-10-CM | POA: Insufficient documentation

## 2017-05-17 DIAGNOSIS — F17200 Nicotine dependence, unspecified, uncomplicated: Secondary | ICD-10-CM | POA: Insufficient documentation

## 2017-05-17 DIAGNOSIS — Y999 Unspecified external cause status: Secondary | ICD-10-CM | POA: Insufficient documentation

## 2017-05-17 DIAGNOSIS — Y929 Unspecified place or not applicable: Secondary | ICD-10-CM | POA: Insufficient documentation

## 2017-05-17 MED ORDER — AMOXICILLIN-POT CLAVULANATE 875-125 MG PO TABS
1.0000 | ORAL_TABLET | Freq: Once | ORAL | Status: AC
  Administered 2017-05-17: 1 via ORAL
  Filled 2017-05-17: qty 1

## 2017-05-17 MED ORDER — AMOXICILLIN-POT CLAVULANATE 875-125 MG PO TABS: 1.0000 | ORAL_TABLET | Freq: Two times a day (BID) | ORAL | 0 refills | Status: DC

## 2017-05-17 NOTE — Discharge Instructions (Signed)
Please return if the redness increases, he had fevers or other concerns.
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
## 2017-05-17 NOTE — ED Triage Notes (Addendum)
Hand injury. Something fell on his hand yesterday. Swelling, redness and pain. Abrasion noted.

## 2017-06-17 ENCOUNTER — Emergency Department (HOSPITAL_COMMUNITY)
Admission: EM | Admit: 2017-06-17 | Discharge: 2017-06-17 | Disposition: A | Discharge status: Home / Self Care | Payer: Self-pay | Attending: Emergency Medicine | Admitting: Emergency Medicine

## 2017-06-17 ENCOUNTER — Emergency Department (HOSPITAL_COMMUNITY): Payer: Self-pay

## 2017-06-17 ENCOUNTER — Encounter (HOSPITAL_COMMUNITY): Payer: Self-pay | Admitting: Emergency Medicine

## 2017-06-17 DIAGNOSIS — Z79899 Other long term (current) drug therapy: Secondary | ICD-10-CM | POA: Insufficient documentation

## 2017-06-17 DIAGNOSIS — M791 Myalgia, unspecified site: Secondary | ICD-10-CM | POA: Insufficient documentation

## 2017-06-17 DIAGNOSIS — M7918 Myalgia, other site: Secondary | ICD-10-CM

## 2017-06-17 DIAGNOSIS — F17210 Nicotine dependence, cigarettes, uncomplicated: Secondary | ICD-10-CM | POA: Insufficient documentation

## 2017-06-17 MED ORDER — METHOCARBAMOL 500 MG PO TABS: 500.0000 mg | ORAL_TABLET | Freq: Two times a day (BID) | ORAL | 0 refills | Status: DC

## 2017-06-17 MED ORDER — NAPROXEN 375 MG PO TABS: 375.0000 mg | ORAL_TABLET | Freq: Two times a day (BID) | ORAL | 0 refills | Status: DC
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
## 2017-06-17 NOTE — ED Notes (Signed)
Patient transported to X-ray 
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
## 2019-01-04 ENCOUNTER — Other Ambulatory Visit: Payer: Self-pay

## 2019-01-04 DIAGNOSIS — Z20822 Contact with and (suspected) exposure to covid-19: Secondary | ICD-10-CM

## 2019-01-05 LAB — NOVEL CORONAVIRUS, NAA: SARS-CoV-2, NAA: NOT DETECTED

## 2021-07-30 ENCOUNTER — Other Ambulatory Visit: Payer: Self-pay

## 2021-07-30 ENCOUNTER — Encounter (HOSPITAL_COMMUNITY): Payer: Self-pay

## 2021-07-30 ENCOUNTER — Ambulatory Visit (HOSPITAL_COMMUNITY)
Admission: EM | Admit: 2021-07-30 | Discharge: 2021-07-30 | Disposition: A | Discharge status: Home / Self Care | Payer: Medicaid Other | Attending: Physician Assistant | Admitting: Physician Assistant

## 2021-07-30 DIAGNOSIS — J020 Streptococcal pharyngitis: Secondary | ICD-10-CM

## 2021-07-30 LAB — POCT INFECTIOUS MONO SCREEN, ED / UC: Mono Screen: NEGATIVE

## 2021-07-30 LAB — POCT RAPID STREP A, ED / UC: Streptococcus, Group A Screen (Direct): POSITIVE — AB

## 2021-07-30 MED ORDER — METHYLPREDNISOLONE SODIUM SUCC 125 MG IJ SOLR
INTRAMUSCULAR | Status: AC
  Filled 2021-07-30: qty 2

## 2021-07-30 MED ORDER — METHYLPREDNISOLONE SODIUM SUCC 125 MG IJ SOLR
60.0000 mg | Freq: Once | INTRAMUSCULAR | Status: AC
  Administered 2021-07-30: 60 mg via INTRAMUSCULAR

## 2021-07-30 MED ORDER — AMOXICILLIN 400 MG/5ML PO SUSR: 500.0000 mg | Freq: Two times a day (BID) | ORAL | 0 refills | Status: AC

## 2021-07-30 NOTE — ED Provider Notes (Signed)
?[facility] ? ? ? ?CSN: [id number] ?Arrival date & time: 07/30/21  1517 ? ? ?  ? ?History   ?Chief Complaint ?Chief Complaint  ?Patient presents with  ? Sore Throat  ? Fever  ? ? ?HPI ?Bradley Underwood is a 31 y.o. male.  ? ?Patient presents today with a 2-day history of severe sore throat.  Reports associated subjective fever, decreased appetite, diarrhea.  Denies any cough, congestion, chest pain, shortness of breath.  Does report difficulty swallowing as result of pain with pain being rated 9 on a 0-10 pain scale.  He has tried TheraFlu and Tylenol without improvement.  Denies any known sick contacts.  Denies any recent antibiotic use.  Denies any difficulty breathing, difficulty managing secretions, muffled voice. ? ? ?History reviewed. No pertinent past medical history. ? ?There are no problems to display for this patient. ? ? ?History reviewed. No pertinent surgical history. ? ? ? ? ?Home Medications   ? ?Prior to Admission medications   ?Medication Sig Start Date End Date Taking? Authorizing Provider  ?amoxicillin (AMOXIL) 400 MG/5ML suspension Take 6.3 mLs (500 mg total) by mouth 2 (two) times daily for 10 days. 07/30/21 08/09/21 Yes Macklyn Glandon, Derry Skill, PA-C  ?HYDROcodone-acetaminophen (NORCO/VICODIN) 5-325 MG tablet Take 1-2 tablets by mouth every 4 (four) hours as needed. 08/27/16   Drenda Freeze, MD  ?methocarbamol (ROBAXIN) 500 MG tablet Take 1 tablet (500 mg total) by mouth 2 (two) times daily. 06/17/17   Etta Quill, NP  ? ? ?Family History ?History reviewed. No pertinent family history. ? ?Social History ?Social History  ? ?Tobacco Use  ? Smoking status: Every Day  ?  Packs/day: 0.50  ?  Types: Cigarettes  ? Smokeless tobacco: Never  ?Substance Use Topics  ? Alcohol use: No  ? Drug use: No  ? ? ? ?Allergies   ?Patient has no known allergies. ? ? ?Review of Systems ?Review of Systems  ?Constitutional:  Positive for activity change, appetite change and fever. Negative for fatigue.  ?HENT:   Positive for sore throat and trouble swallowing. Negative for congestion, sinus pressure, sneezing and voice change.   ?Respiratory:  Negative for cough and shortness of breath.   ?Cardiovascular:  Negative for chest pain.  ?Gastrointestinal:  Negative for abdominal pain, diarrhea, nausea and vomiting.  ?Neurological:  Negative for dizziness, light-headedness and headaches.  ? ? ?Physical Exam ?Triage Vital Signs ?ED Triage Vitals  ?Enc Vitals Group  ?   BP 07/30/21 1632 106/81  ?   Pulse Rate 07/30/21 1632 (!) 105  ?   Resp 07/30/21 1632 18  ?   Temp 07/30/21 1632 99.9 ?F (37.7 ?C)  ?   Temp Source 07/30/21 1632 Oral  ?   SpO2 07/30/21 1632 94 %  ?   Weight --   ?   Height --   ?   Head Circumference --   ?   Peak Flow --   ?   Pain Score 07/30/21 1629 0  ?   Pain Loc --   ?   Pain Edu? --   ?   Excl. in [place]? --   ? ?No data found. ? ?Updated Vital Signs ?BP 106/81 (BP Location: Right Arm)   Pulse (!) 105   Temp 99.9 ?F (37.7 ?C) (Oral)   Resp 18   SpO2 94%  ? ?Visual Acuity ?Right Eye Distance:   ?Left Eye Distance:   ?Bilateral Distance:   ? ?Right Eye Near:   ?Left  Eye Near:    ?Bilateral Near:    ? ?Physical Exam ?Vitals reviewed.  ?Constitutional:   ?   General: He is awake.  ?   Appearance: Normal appearance. He is well-developed. He is not ill-appearing.  ?   Comments: Very pleasant male appears stated age in no acute distress sitting comfortably in exam room  ?HENT:  ?   Head: Normocephalic and atraumatic.  ?   Right Ear: Tympanic membrane, ear canal and external ear normal. Tympanic membrane is not erythematous or bulging.  ?   Left Ear: Tympanic membrane, ear canal and external ear normal. Tympanic membrane is not erythematous or bulging.  ?   Nose: Nose normal.  ?   Mouth/Throat:  ?   Pharynx: Uvula midline. Posterior oropharyngeal erythema present. No oropharyngeal exudate or uvula swelling.  ?   Tonsils: Tonsillar exudate present. No tonsillar abscesses. 2+ on the right. 2+ on the left.   ?Cardiovascular:  ?   Rate and Rhythm: Normal rate and regular rhythm.  ?   Heart sounds: Normal heart sounds, S1 normal and S2 normal. No murmur heard. ?Pulmonary:  ?   Effort: Pulmonary effort is normal. No accessory muscle usage or respiratory distress.  ?   Breath sounds: Normal breath sounds. No stridor. No wheezing, rhonchi or rales.  ?   Comments: Clear to auscultation bilaterally ?Abdominal:  ?   General: Bowel sounds are normal.  ?   Palpations: Abdomen is soft.  ?   Tenderness: There is no abdominal tenderness.  ?Lymphadenopathy:  ?   Head:  ?   Right side of head: No submental, submandibular or tonsillar adenopathy.  ?   Left side of head: No submental, submandibular or tonsillar adenopathy.  ?   Cervical: No cervical adenopathy.  ?Neurological:  ?   Mental Status: He is alert.  ?Psychiatric:     ?   Behavior: Behavior is cooperative.  ? ? ? ?UC Treatments / Results  ?Labs ?(all labs ordered are listed, but only abnormal results are displayed) ?Labs Reviewed  ?POCT RAPID STREP A, ED / UC - Abnormal; Notable for the following components:  ?    Result Value  ? Streptococcus, Group A Screen (Direct) POSITIVE (*)   ? All other components within normal limits  ?POCT INFECTIOUS MONO SCREEN, ED / UC  ? ? ?EKG ? ? ?Radiology ?No results found. ? ?Procedures ?Procedures (including critical care time) ? ?Medications Ordered in UC ?Medications  ?methylPREDNISolone sodium succinate (SOLU-MEDROL) 125 mg/2 mL injection 60 mg (has no administration in time range)  ? ? ?Initial Impression / Assessment and Plan / UC Course  ?I have reviewed the triage vital signs and the nursing notes. ? ?Pertinent labs & imaging results that were available during my care of the patient were reviewed by me and considered in my medical decision making (see chart for details). ? ?  ? ?Centor score of 2; rapid strep positive in clinic.  Mono testing was negative.  Given severity of tonsillar swelling and discomfort patient was given 60 mg  of Solu-Medrol in clinic.  He was started on amoxicillin 500 mg twice daily.  Can use over-the-counter medications as well as warm salt water gargles for additional symptom relief.  He was instructed to replace his toothbrush within a few days of starting medication to prevent reinfection.  Discussed that if he has any worsening symptoms including inability to swallow, shortness of breath, swelling of his throat he needs to go to the  emergency room.  If symptoms or not improving within a few days he should return here or see PCP.  Work excuse note provided. ? ?Final Clinical Impressions(s) / UC Diagnoses  ? ?Final diagnoses:  ?Strep pharyngitis  ? ? ? ?Discharge Instructions   ? ?  ?You are positive for strep throat.  Start amoxicillin twice daily.  Gargle with warm salt water for additional symptom relief and use Tylenol ibuprofen for pain.  Replace your toothbrush after being on the antibiotic for several days as you can get this again.  If your symptoms not improving quickly you should be reevaluated.  If anything worsens and you have difficulty swallowing your saliva, inability to swallow liquids, high fever not responding to medication, shortness of breath, throat swelling you need to go to the emergency room. ? ? ? ? ?ED Prescriptions   ? ? Medication Sig Dispense Auth. Provider  ? amoxicillin (AMOXIL) 400 MG/5ML suspension Take 6.3 mLs (500 mg total) by mouth 2 (two) times daily for 10 days. 125 mL Laiken Nohr K, PA-C  ? ?  ? ?PDMP not reviewed this encounter. ?  ?Terrilee Croak, PA-C ?07/30/21 1729 ? ?

## 2021-07-30 NOTE — ED Triage Notes (Signed)
Pt presents with c/o sore throat, fever and no appetite X 2 days.  ?

## 2021-07-30 NOTE — Discharge Instructions (Signed)
You are positive for strep throat.  Start amoxicillin twice daily.  Gargle with warm salt water for additional symptom relief and use Tylenol ibuprofen for pain.  Replace your toothbrush after being on the antibiotic for several days as you can get this again.  If your symptoms not improving quickly you should be reevaluated.  If anything worsens and you have difficulty swallowing your saliva, inability to swallow liquids, high fever not responding to medication, shortness of breath, throat swelling you need to go to the emergency room. ?

## 2021-08-01 ENCOUNTER — Emergency Department (HOSPITAL_COMMUNITY): Payer: Self-pay

## 2021-08-01 ENCOUNTER — Emergency Department (HOSPITAL_COMMUNITY)
Admission: EM | Admit: 2021-08-01 | Discharge: 2021-08-01 | Disposition: A | Discharge status: Home / Self Care | Payer: Self-pay | Attending: Emergency Medicine | Admitting: Emergency Medicine

## 2021-08-01 ENCOUNTER — Other Ambulatory Visit: Payer: Self-pay

## 2021-08-01 DIAGNOSIS — J36 Peritonsillar abscess: Secondary | ICD-10-CM | POA: Insufficient documentation

## 2021-08-01 LAB — CBC WITH DIFFERENTIAL/PLATELET
Abs Immature Granulocytes: 0.09 10*3/uL — ABNORMAL HIGH (ref 0.00–0.07)
Basophils Absolute: 0.1 10*3/uL (ref 0.0–0.1)
Basophils Relative: 0 %
Eosinophils Absolute: 0.2 10*3/uL (ref 0.0–0.5)
Eosinophils Relative: 1 %
HCT: 44.9 % (ref 39.0–52.0)
Hemoglobin: 15.7 g/dL (ref 13.0–17.0)
Immature Granulocytes: 1 %
Lymphocytes Relative: 16 %
Lymphs Abs: 2.4 10*3/uL (ref 0.7–4.0)
MCH: 29.3 pg (ref 26.0–34.0)
MCHC: 35 g/dL (ref 30.0–36.0)
MCV: 83.8 fL (ref 80.0–100.0)
Monocytes Absolute: 1.3 10*3/uL — ABNORMAL HIGH (ref 0.1–1.0)
Monocytes Relative: 9 %
Neutro Abs: 11 10*3/uL — ABNORMAL HIGH (ref 1.7–7.7)
Neutrophils Relative %: 73 %
Platelets: 388 10*3/uL (ref 150–400)
RBC: 5.36 MIL/uL (ref 4.22–5.81)
RDW: 11.9 % (ref 11.5–15.5)
WBC: 15 10*3/uL — ABNORMAL HIGH (ref 4.0–10.5)
nRBC: 0 % (ref 0.0–0.2)

## 2021-08-01 LAB — BASIC METABOLIC PANEL
Anion gap: 12 (ref 5–15)
BUN: 16 mg/dL (ref 6–20)
CO2: 25 mmol/L (ref 22–32)
Calcium: 9.5 mg/dL (ref 8.9–10.3)
Chloride: 100 mmol/L (ref 98–111)
Creatinine, Ser: 0.88 mg/dL (ref 0.61–1.24)
GFR, Estimated: >60 mL/min (ref >60)
Glucose, Bld: 113 mg/dL — ABNORMAL HIGH (ref 70–99)
Potassium: 4.1 mmol/L (ref 3.5–5.1)
Sodium: 137 mmol/L (ref 135–145)

## 2021-08-01 MED ORDER — CLINDAMYCIN HCL 150 MG PO CAPS: 300.0000 mg | ORAL_CAPSULE | Freq: Three times a day (TID) | ORAL | 0 refills | Status: DC

## 2021-08-01 MED ORDER — DEXAMETHASONE SODIUM PHOSPHATE 10 MG/ML IJ SOLN
10.0000 mg | Freq: Once | INTRAMUSCULAR | Status: AC
Start: 2021-08-01 — End: 2021-08-01
  Administered 2021-08-01: 10 mg via INTRAVENOUS
  Filled 2021-08-01: qty 1

## 2021-08-01 MED ORDER — IOHEXOL 300 MG/ML  SOLN
75.0000 mL | Freq: Once | INTRAMUSCULAR | Status: AC | PRN
  Administered 2021-08-01: 75 mL via INTRAVENOUS

## 2021-08-01 MED ORDER — CLINDAMYCIN PHOSPHATE 600 MG/50ML IV SOLN
600.0000 mg | Freq: Once | INTRAVENOUS | Status: AC
  Administered 2021-08-01: 600 mg via INTRAVENOUS
  Filled 2021-08-01: qty 50

## 2021-08-01 MED ORDER — HYDROCODONE-ACETAMINOPHEN 7.5-325 MG/15ML PO SOLN: 15.0000 mL | Freq: Four times a day (QID) | ORAL | 0 refills | Status: DC | PRN

## 2021-08-01 NOTE — ED Provider Notes (Signed)
?[facility] EMERGENCY DEPARTMENT ?Provider Note ? ? ?CSN: [id number] ?Arrival date & time: 08/01/21  0116 ? ?  ? ?History ? ?Chief Complaint  ?Patient presents with  ? Sore Throat  ? ? ?Manveer Gomes is a 31 y.o. male. ? ?The history is provided by the patient and medical records.  ?Sore Throat ? ?31 year old male presenting to the ED with sore throat.  Seen at urgent care 07/30/21 and was diagnosed with strep throat via rapid swab.  He was started on amoxicillin but states symptoms are worsening.  States he has more pain on his left side and now starting to have some difficulty swallowing.  He denies any fever.  No other sick contacts. ? ?Home Medications ?Prior to Admission medications   ?Medication Sig Start Date End Date Taking? Authorizing Provider  ?clindamycin (CLEOCIN) 150 MG capsule Take 2 capsules (300 mg total) by mouth 3 (three) times daily. May dispense as 150mg  capsules 08/01/21  Yes 08/03/21, PA-C  ?HYDROcodone-acetaminophen (HYCET) 7.5-325 mg/15 ml solution Take 15 mLs by mouth 4 (four) times daily as needed for moderate pain. 08/01/21 08/01/22 Yes 08/03/22, PA-C  ?amoxicillin (AMOXIL) 400 MG/5ML suspension Take 6.3 mLs (500 mg total) by mouth 2 (two) times daily for 10 days. 07/30/21 08/09/21  Raspet, 08/11/21, PA-C  ?methocarbamol (ROBAXIN) 500 MG tablet Take 1 tablet (500 mg total) by mouth 2 (two) times daily. 06/17/17   06/19/17, NP  ?   ? ?Allergies    ?Patient has no known allergies.   ? ?Review of Systems   ?Review of Systems  ?HENT:  Positive for sore throat.   ?All other systems reviewed and are negative. ? ?Physical Exam ?Updated Vital Signs ?BP 119/61   Pulse 86   Temp 98 ?F (36.7 ?C)   Resp 16   SpO2 96%  ? ?Physical Exam ?Vitals and nursing note reviewed.  ?Constitutional:   ?   Appearance: He is well-developed.  ?HENT:  ?   Head: Normocephalic and atraumatic.  ?   Mouth/Throat:  ?   Comments: Asymmetric tonsillar edema, left > right, uvula slightly  deviated right, hot potato voice but handling secretions, no stridor ?Eyes:  ?   Conjunctiva/sclera: Conjunctivae normal.  ?   Pupils: Pupils are equal, round, and reactive to light.  ?Cardiovascular:  ?   Rate and Rhythm: Normal rate and regular rhythm.  ?   Heart sounds: Normal heart sounds.  ?Pulmonary:  ?   Effort: Pulmonary effort is normal.  ?   Breath sounds: Normal breath sounds.  ?Abdominal:  ?   General: Bowel sounds are normal.  ?   Palpations: Abdomen is soft.  ?Musculoskeletal:     ?   General: Normal range of motion.  ?   Cervical back: Normal range of motion.  ?Skin: ?   General: Skin is warm and dry.  ?Neurological:  ?   Mental Status: He is alert and oriented to person, place, and time.  ? ? ?ED Results / Procedures / Treatments   ?Labs ?(all labs ordered are listed, but only abnormal results are displayed) ?Labs Reviewed  ?CBC WITH DIFFERENTIAL/PLATELET - Abnormal; Notable for the following components:  ?    Result Value  ? WBC 15.0 (*)   ? Neutro Abs 11.0 (*)   ? Monocytes Absolute 1.3 (*)   ? Abs Immature Granulocytes 0.09 (*)   ? All other components within normal limits  ?BASIC METABOLIC PANEL - Abnormal; Notable  for the following components:  ? Glucose, Bld 113 (*)   ? All other components within normal limits  ? ? ?EKG ?None ? ?Radiology ?CT Soft Tissue Neck W Contrast ? ?Result Date: 08/01/2021 ?CLINICAL DATA:  Epiglottitis or tonsillitis suspected, strep positive EXAM: CT NECK WITH CONTRAST TECHNIQUE: Multidetector CT imaging of the neck was performed using the standard protocol following the bolus administration of intravenous contrast. RADIATION DOSE REDUCTION: This exam was performed according to the departmental dose-optimization program which includes automated exposure control, adjustment of the mA and/or kV according to patient size and/or use of iterative reconstruction technique. CONTRAST:  75mL OMNIPAQUE IOHEXOL 300 MG/ML  SOLN COMPARISON:  08/27/2016 FINDINGS: Pharynx and larynx:  Enlargement of the left-greater-than-right palatine tonsil, with a left peritonsillar fluid collection mild peripheral enhancement that measures 2.2 x 2.1 x 2.6 cm (series 3, image 37 and series 7, image 57), concerning for peritonsillar abscess. This is approximately the same location as the peritonsillar abscess seen on 08/27/2016. Edema in the pharynx extends superiorly to the nasopharynx and inferiorly to the level of the vallecula. The airway is significantly narrowed (series 3, image 30) no evidence of epiglottitis. Salivary glands: No inflammation, mass, or stone. Thyroid: Normal. Lymph nodes: Enlarged bilateral cervical lymph nodes, which are not suppurative and are favored to be reactive. Vascular: Patent. Limited intracranial: Negative. Visualized orbits: Minimally included in the field of view. Negative. Mastoids and visualized paranasal sinuses: Clear Skeleton: No acute osseous abnormality. Upper chest: Negative. Other: None. IMPRESSION: Left peritonsillar abscess measuring up to 2.6 cm, in approximately the same location as the abscess seen on 08/27/2016. Enlargement of left-greater-than-right palatine tonsil, with more extensive left than right pharyngeal edema. Significant narrowing of the airway. Electronically Signed   By: Wiliam KeAlison  Vasan M.D.   On: 08/01/2021 03:21   ? ?Procedures ?Procedures  ? ? ?Medications Ordered in ED ?Medications  ?iohexol (OMNIPAQUE) 300 MG/ML solution 75 mL (75 mLs Intravenous Contrast Given 08/01/21 0306)  ?dexamethasone (DECADRON) injection 10 mg (10 mg Intravenous Given 08/01/21 0335)  ?clindamycin (CLEOCIN) IVPB 600 mg (0 mg Intravenous Stopped 08/01/21 0408)  ? ? ?ED Course/ Medical Decision Making/ A&P ?  ?                        ?Medical Decision Making ?Amount and/or Complexity of Data Reviewed ?Labs: ordered. ?Radiology: ordered. ? ?Risk ?Prescription drug management. ? ? ? ?31 year old male presenting to the ED with worsening sore throat.  Diagnosed with strep 2 days  ago at urgent care, currently on amoxicillin.  States tonight started having more difficulty swallowing.  He is afebrile and nontoxic but does have muffled, hot potato voice on exam.  He has asymmetric tonsillar edema, left > right, uvula seems deviated slightly.  He is handling secretions and able to swallow, but pain with doing so.  Concern for possible PTA.  Will obtain labs, CT of the neck. ? ?CT does confirm left PTA approx 2.6cm in size.  Does have some airway narrowing seen on CT.  Given IV decadron, clindamycin.  Will discuss with ENT. ? ?Discussed with Dr. Pollyann Kennedyosen-- agrees with IV decadron and clindamycin here, home with same.  Does not feel he needs emergent drainage.  Can follow-up in clinic. ? ?Patient resting comfortably.  He has tolerated PO fluids.  Remains without emergent airway concerns.  Stable for discharge.  Given ENT follow-up.  Strict return precautions for any new/acute changes. ? ?Final Clinical Impression(s) / ED Diagnoses ?  Final diagnoses:  ?Peritonsillar abscess  ? ? ?Rx / DC Orders ?ED Discharge Orders   ? ?      Ordered  ?  HYDROcodone-acetaminophen (HYCET) 7.5-325 mg/15 ml solution  4 times daily PRN       ? 08/01/21 0502  ?  clindamycin (CLEOCIN) 150 MG capsule  3 times daily       ? 08/01/21 0502  ? ?  ?  ? ?  ? ? ?  ?Garlon Hatchet, PA-C ?08/01/21 5009 ? ?  ?Dione Booze, MD ?08/01/21 [phone] ? ?

## 2021-08-01 NOTE — ED Provider Triage Note (Signed)
Emergency Medicine Provider Triage Evaluation Note ? ?Bradley Underwood , a 31 y.o. male  was evaluated in triage.  Pt complains of sore throat.  States was seen at [facility] and diagnosed with strep throat 07/30/20.  Has been taking amoxicillin since then but symptoms seem to be worsening.  States now he started to have a lot of trouble swallowing and feeling a little short of breath.  States pain is worse on his left side. ? ?Review of Systems  ?Positive: Sore throat, trouble swallowing ?Negative: fever ? ?Physical Exam  ?BP (!) 151/97 (BP Location: Right Arm)   Pulse 96   Temp 98 ?F (36.7 ?C)   Resp 18   SpO2 96%  ? ?Gen:   Awake, no distress   ?Resp:  Normal effort  ?MSK:   Moves extremities without difficulty  ?Other:  Tonsils 2+, left > right, hot potato voice noted, handling secretions normally, difficulty/painful swallowing observed ? ?Medical Decision Making  ?Medically screening exam initiated at 1:28 AM.  Appropriate orders placed.  Bradley Underwood was informed that the remainder of the evaluation will be completed by another provider, this initial triage assessment does not replace that evaluation, and the importance of remaining in the ED until their evaluation is complete. ? ?Sore throat.  Recent positive strep test, on amoxicillin but symptoms worsening.  Tonsils are asymmetric, left greater than right.  Hot potato voice during exam.  Concern for possible development of PTA.  Will obtain labs, CT soft tissue neck. ?  ?Garlon Hatchet, PA-C ?08/01/21 0130 ? ?

## 2021-08-01 NOTE — ED Triage Notes (Signed)
Pt presents to the ED with c/o sore throat and ear pain. Pt states he has been having these symptoms since Monday. Was seen at [facility] and prescribed amoxicillin with no relief in symptoms.  ?

## 2021-08-01 NOTE — Discharge Instructions (Addendum)
Take the prescribed medication as directed. ?Follow-up with ENT-- call clinic to get appt scheduled. ?Return to the ED for new or worsening symptoms. ?

## 2021-08-02 ENCOUNTER — Emergency Department (HOSPITAL_COMMUNITY)
Admission: EM | Admit: 2021-08-02 | Discharge: 2021-08-02 | Disposition: A | Discharge status: Home / Self Care | Payer: Medicaid Other | Attending: Emergency Medicine | Admitting: Emergency Medicine

## 2021-08-02 ENCOUNTER — Encounter (HOSPITAL_COMMUNITY): Payer: Self-pay

## 2021-08-02 ENCOUNTER — Other Ambulatory Visit: Payer: Self-pay

## 2021-08-02 DIAGNOSIS — J36 Peritonsillar abscess: Secondary | ICD-10-CM

## 2021-08-02 LAB — BASIC METABOLIC PANEL
Anion gap: 11 (ref 5–15)
BUN: 11 mg/dL (ref 6–20)
CO2: 25 mmol/L (ref 22–32)
Calcium: 9.1 mg/dL (ref 8.9–10.3)
Chloride: 99 mmol/L (ref 98–111)
Creatinine, Ser: 0.7 mg/dL (ref 0.61–1.24)
GFR, Estimated: >60 mL/min (ref >60)
Glucose, Bld: 102 mg/dL — ABNORMAL HIGH (ref 70–99)
Potassium: 4 mmol/L (ref 3.5–5.1)
Sodium: 135 mmol/L (ref 135–145)

## 2021-08-02 LAB — CBC WITH DIFFERENTIAL/PLATELET
Abs Immature Granulocytes: 0.15 10*3/uL — ABNORMAL HIGH (ref 0.00–0.07)
Basophils Absolute: 0 10*3/uL (ref 0.0–0.1)
Basophils Relative: 0 %
Eosinophils Absolute: 0.1 10*3/uL (ref 0.0–0.5)
Eosinophils Relative: 1 %
HCT: 41.5 % (ref 39.0–52.0)
Hemoglobin: 14.5 g/dL (ref 13.0–17.0)
Immature Granulocytes: 1 %
Lymphocytes Relative: 18 %
Lymphs Abs: 2.4 10*3/uL (ref 0.7–4.0)
MCH: 29.2 pg (ref 26.0–34.0)
MCHC: 34.9 g/dL (ref 30.0–36.0)
MCV: 83.5 fL (ref 80.0–100.0)
Monocytes Absolute: 1.3 10*3/uL — ABNORMAL HIGH (ref 0.1–1.0)
Monocytes Relative: 10 %
Neutro Abs: 9.7 10*3/uL — ABNORMAL HIGH (ref 1.7–7.7)
Neutrophils Relative %: 70 %
Platelets: 348 10*3/uL (ref 150–400)
RBC: 4.97 MIL/uL (ref 4.22–5.81)
RDW: 11.7 % (ref 11.5–15.5)
WBC: 13.7 10*3/uL — ABNORMAL HIGH (ref 4.0–10.5)
nRBC: 0 % (ref 0.0–0.2)

## 2021-08-02 MED ORDER — CLINDAMYCIN PHOSPHATE 600 MG/50ML IV SOLN
600.0000 mg | Freq: Once | INTRAVENOUS | Status: AC
  Administered 2021-08-02: 600 mg via INTRAVENOUS
  Filled 2021-08-02: qty 50

## 2021-08-02 MED ORDER — SODIUM CHLORIDE 0.9 % IV BOLUS
1000.0000 mL | Freq: Once | INTRAVENOUS | Status: AC
  Administered 2021-08-02: 1000 mL via INTRAVENOUS

## 2021-08-02 MED ORDER — CLINDAMYCIN HCL 150 MG PO CAPS: 300.0000 mg | ORAL_CAPSULE | Freq: Three times a day (TID) | ORAL | 0 refills | Status: DC

## 2021-08-02 MED ORDER — DEXAMETHASONE SODIUM PHOSPHATE 10 MG/ML IJ SOLN
10.0000 mg | Freq: Once | INTRAMUSCULAR | Status: AC
  Administered 2021-08-02: 10 mg via INTRAVENOUS
  Filled 2021-08-02: qty 1

## 2021-08-02 NOTE — ED Notes (Signed)
Pt verbalized understanding of d/c instructions, meds and followup care. Denies questions. VSS, no distress noted. Steady gait to exit with all belongings.  ?

## 2021-08-02 NOTE — ED Provider Notes (Signed)
Couple of ?[facility] EMERGENCY DEPARTMENT ?Provider Note ? ? ?CSN: [id number] ?Arrival date & time: 08/02/21  1054 ? ?  ? ?History ?PMH of recent diagnosis of strep throat and PTA ?Chief Complaint  ?Patient presents with  ? Sore Throat  ? ? ?Bradley Underwood is a 31 y.o. male. ?Presents the emergency department with a chief complaint of sore throat.  He states that the sore throat started about a week ago.  Days ago, he was seen by urgent care and diagnosed with strep throat.  They put him on Augmentin at that time he returned to our emergency department on 3/17 around 0300.  He was diagnosed with a left peritonsillar abscess right side 2.6 cm in size.  CT revealed some airway narrowing.  He was given IV Decadron and clindamycin at this time.  Patient was discharged home given that ENT declared it was not drainable.  According to patient, he never picked up his antibiotics at discharge.  His sore throat has gradually worsened since yesterday.  He says that now he has pain that radiates up to his left ear.  He feels like his neck is swelling more on the left side.  He is having difficulty swallowing and tolerating p.o.  He denies any fevers or chills. ? ?Sore Throat ?Pertinent negatives include no chest pain and no abdominal pain.  ? ?  ? ?Home Medications ?Prior to Admission medications   ?Medication Sig Start Date End Date Taking? Authorizing Provider  ?amoxicillin (AMOXIL) 400 MG/5ML suspension Take 6.3 mLs (500 mg total) by mouth 2 (two) times daily for 10 days. 07/30/21 08/09/21  Raspet, Noberto Retort, PA-C  ?clindamycin (CLEOCIN) 150 MG capsule Take 2 capsules (300 mg total) by mouth 3 (three) times daily. May dispense as 150mg  capsules 08/02/21   Surya Schroeter, 08/04/21, PA-C  ?HYDROcodone-acetaminophen (HYCET) 7.5-325 mg/15 ml solution Take 15 mLs by mouth 4 (four) times daily as needed for moderate pain. 08/01/21 08/01/22  08/03/22, PA-C  ?methocarbamol (ROBAXIN) 500 MG tablet Take 1 tablet (500 mg  total) by mouth 2 (two) times daily. 06/17/17   06/19/17, NP  ?   ? ?Allergies    ?Patient has no known allergies.   ? ?Review of Systems   ?Review of Systems  ?Constitutional:  Positive for appetite change. Negative for fever.  ?HENT:  Positive for ear pain, sore throat and trouble swallowing. Negative for congestion and rhinorrhea.   ?Cardiovascular:  Negative for chest pain.  ?Gastrointestinal:  Negative for abdominal pain, nausea and vomiting.  ?Musculoskeletal:  Positive for neck pain.  ?All other systems reviewed and are negative. ? ?Physical Exam ?Updated Vital Signs ?BP 139/89 (BP Location: Left Arm)   Pulse 85   Temp 99.2 ?F (37.3 ?C) (Oral)   Resp 16   Ht 5\' 6"  (1.676 m)   Wt 93 kg   SpO2 96%   BMI 33.09 kg/m?  ?Physical Exam ?Vitals and nursing note reviewed.  ?Constitutional:   ?   General: He is not in acute distress. ?   Appearance: Normal appearance. He is not ill-appearing, toxic-appearing or diaphoretic.  ?HENT:  ?   Head: Normocephalic and atraumatic.  ?   Nose: No nasal deformity, congestion or rhinorrhea.  ?   Mouth/Throat:  ?   Lips: Pink. No lesions.  ?   Mouth: Mucous membranes are moist. No injury, lacerations, oral lesions or angioedema.  ?   Pharynx: Pharyngeal swelling, oropharyngeal exudate, posterior oropharyngeal erythema and uvula  swelling present.  ?   Comments: There is severe tonsillar swelling bilaterally.  Exudate present.  Uvula swollen and deviated to the right. I can visualize a PTA on the left. Difficult to see well due to mild trismus.  Patient does have hot potato voice.  There is left anterior lymphadenopathy. ?Eyes:  ?   General: Gaze aligned appropriately. No scleral icterus.    ?   Right eye: No discharge.     ?   Left eye: No discharge.  ?   Conjunctiva/sclera: Conjunctivae normal.  ?   Right eye: Right conjunctiva is not injected. No exudate or hemorrhage. ?   Left eye: Left conjunctiva is not injected. No exudate or hemorrhage. ?   Pupils: Pupils are equal,  round, and reactive to light.  ?Cardiovascular:  ?   Rate and Rhythm: Normal rate and regular rhythm.  ?   Pulses: Normal pulses.     ?     Radial pulses are 2+ on the right side and 2+ on the left side.  ?     Dorsalis pedis pulses are 2+ on the right side and 2+ on the left side.  ?   Heart sounds: Normal heart sounds, S1 normal and S2 normal. Heart sounds not distant. No murmur heard. ?  No friction rub. No gallop. No S3 or S4 sounds.  ?Pulmonary:  ?   Effort: Pulmonary effort is normal. No accessory muscle usage or respiratory distress.  ?   Breath sounds: Normal breath sounds. No stridor. No wheezing, rhonchi or rales.  ?Chest:  ?   Chest wall: No tenderness.  ?Abdominal:  ?   General: Abdomen is flat. Bowel sounds are normal. There is no distension.  ?   Palpations: Abdomen is soft. There is no mass or pulsatile mass.  ?   Tenderness: There is no abdominal tenderness. There is no guarding or rebound.  ?Musculoskeletal:  ?   Right lower leg: No edema.  ?   Left lower leg: No edema.  ?Skin: ?   General: Skin is warm and dry.  ?   Coloration: Skin is not jaundiced or pale.  ?   Findings: No bruising, erythema, lesion or rash.  ?Neurological:  ?   General: No focal deficit present.  ?   Mental Status: He is alert and oriented to person, place, and time.  ?   GCS: GCS eye subscore is 4. GCS verbal subscore is 5. GCS motor subscore is 6.  ?Psychiatric:     ?   Mood and Affect: Mood normal.     ?   Behavior: Behavior normal. Behavior is cooperative.  ? ? ?ED Results / Procedures / Treatments   ?Labs ?(all labs ordered are listed, but only abnormal results are displayed) ?Labs Reviewed  ?CBC WITH DIFFERENTIAL/PLATELET - Abnormal; Notable for the following components:  ?    Result Value  ? WBC 13.7 (*)   ? Neutro Abs 9.7 (*)   ? Monocytes Absolute 1.3 (*)   ? Abs Immature Granulocytes 0.15 (*)   ? All other components within normal limits  ?BASIC METABOLIC PANEL - Abnormal; Notable for the following components:  ?  Glucose, Bld 102 (*)   ? All other components within normal limits  ? ? ?EKG ?None ? ?Radiology ?CT Soft Tissue Neck W Contrast ? ?Result Date: 08/01/2021 ?CLINICAL DATA:  Epiglottitis or tonsillitis suspected, strep positive EXAM: CT NECK WITH CONTRAST TECHNIQUE: Multidetector CT imaging of the neck was performed using  the standard protocol following the bolus administration of intravenous contrast. RADIATION DOSE REDUCTION: This exam was performed according to the departmental dose-optimization program which includes automated exposure control, adjustment of the mA and/or kV according to patient size and/or use of iterative reconstruction technique. CONTRAST:  37mL OMNIPAQUE IOHEXOL 300 MG/ML  SOLN COMPARISON:  08/27/2016 FINDINGS: Pharynx and larynx: Enlargement of the left-greater-than-right palatine tonsil, with a left peritonsillar fluid collection mild peripheral enhancement that measures 2.2 x 2.1 x 2.6 cm (series 3, image 37 and series 7, image 57), concerning for peritonsillar abscess. This is approximately the same location as the peritonsillar abscess seen on 08/27/2016. Edema in the pharynx extends superiorly to the nasopharynx and inferiorly to the level of the vallecula. The airway is significantly narrowed (series 3, image 30) no evidence of epiglottitis. Salivary glands: No inflammation, mass, or stone. Thyroid: Normal. Lymph nodes: Enlarged bilateral cervical lymph nodes, which are not suppurative and are favored to be reactive. Vascular: Patent. Limited intracranial: Negative. Visualized orbits: Minimally included in the field of view. Negative. Mastoids and visualized paranasal sinuses: Clear Skeleton: No acute osseous abnormality. Upper chest: Negative. Other: None. IMPRESSION: Left peritonsillar abscess measuring up to 2.6 cm, in approximately the same location as the abscess seen on 08/27/2016. Enlargement of left-greater-than-right palatine tonsil, with more extensive left than right  pharyngeal edema. Significant narrowing of the airway. Electronically Signed   By: Wiliam Ke M.D.   On: 08/01/2021 03:21   ? ?Procedures ?Procedures  ? ?Medications Ordered in ED ?Medications  ?clindamycin (CLEOCIN) IVPB

## 2021-08-02 NOTE — Discharge Instructions (Signed)
You have a peritonsillar abscess. You need to pick up the Clindamycin from  your pharmacy today and start taking as prescribed. You also should call Dr. Clovis Pu ENT office to schedule a follow up visit.  ?Please return if your symptoms continue to worsen.  ?

## 2021-08-02 NOTE — ED Triage Notes (Signed)
Pt c/o ongoing sore throat. Reports that he was recently diagnosed with strep throat, not taking prescribed abx.  ?

## 2022-02-15 ENCOUNTER — Encounter (HOSPITAL_COMMUNITY): Payer: Self-pay | Admitting: Emergency Medicine

## 2022-02-15 ENCOUNTER — Other Ambulatory Visit: Payer: Self-pay

## 2022-02-15 ENCOUNTER — Emergency Department (HOSPITAL_COMMUNITY)
Admission: EM | Admit: 2022-02-15 | Discharge: 2022-02-15 | Disposition: A | Discharge status: Home / Self Care | Payer: Medicaid Other | Attending: Student | Admitting: Student

## 2022-02-15 DIAGNOSIS — X509XXA Other and unspecified overexertion or strenuous movements or postures, initial encounter: Secondary | ICD-10-CM | POA: Insufficient documentation

## 2022-02-15 DIAGNOSIS — S76311A Strain of muscle, fascia and tendon of the posterior muscle group at thigh level, right thigh, initial encounter: Secondary | ICD-10-CM

## 2022-02-15 DIAGNOSIS — Y9366 Activity, soccer: Secondary | ICD-10-CM | POA: Insufficient documentation

## 2022-02-15 DIAGNOSIS — S76911A Strain of unspecified muscles, fascia and tendons at thigh level, right thigh, initial encounter: Secondary | ICD-10-CM | POA: Insufficient documentation

## 2022-02-15 MED ORDER — MELOXICAM 15 MG PO TABS: 15.0000 mg | ORAL_TABLET | Freq: Every day | ORAL | 0 refills | Status: AC

## 2022-02-15 NOTE — ED Provider Notes (Signed)
[facility] EMERGENCY DEPARTMENT Provider Note   CSN: [id number] Arrival date & time: 02/15/22  2121     History  Chief Complaint  Patient presents with   Muscle Strain     Right Thigh    Bradley Underwood is a 31 y.o. male.  Patient presents to the hospital complaining of right posterior thigh pain.  Patient states he was playing soccer this morning and felt a sudden sharp pain to the posterior of his right thigh.  He states it hurts when he is bending and stretching that portion of his leg.  He denies falling.  Denies knee pain, hip pain.  Patient has no relevant past medical history.  HPI     Home Medications Prior to Admission medications   Medication Sig Start Date End Date Taking? Authorizing Provider  meloxicam (MOBIC) 15 MG tablet Take 1 tablet (15 mg total) by mouth daily. 02/15/22 03/17/22 Yes Dorothyann Peng, PA-C  clindamycin (CLEOCIN) 150 MG capsule Take 2 capsules (300 mg total) by mouth 3 (three) times daily. May dispense as 150mg  capsules 08/02/21   Loeffler, Adora Fridge, PA-C  HYDROcodone-acetaminophen (HYCET) 7.5-325 mg/15 ml solution Take 15 mLs by mouth 4 (four) times daily as needed for moderate pain. 08/01/21 08/01/22  Larene Pickett, PA-C  methocarbamol (ROBAXIN) 500 MG tablet Take 1 tablet (500 mg total) by mouth 2 (two) times daily. 06/17/17   Etta Quill, NP      Allergies    Patient has no known allergies.    Review of Systems   Review of Systems  Musculoskeletal:  Positive for myalgias.    Physical Exam Updated Vital Signs BP 137/86 (BP Location: Right Arm)   Pulse 81   Temp 99.6 F (37.6 C) (Oral)   Resp 18   SpO2 98%  Physical Exam Vitals and nursing note reviewed.  Constitutional:      General: He is not in acute distress.    Appearance: He is well-developed.  HENT:     Head: Normocephalic and atraumatic.  Eyes:     Conjunctiva/sclera: Conjunctivae normal.  Cardiovascular:     Rate and Rhythm: Normal rate and regular  rhythm.     Heart sounds: No murmur heard. Pulmonary:     Effort: Pulmonary effort is normal. No respiratory distress.     Breath sounds: Normal breath sounds.  Abdominal:     Palpations: Abdomen is soft.     Tenderness: There is no abdominal tenderness.  Musculoskeletal:        General: Tenderness present. No swelling. Normal range of motion.     Cervical back: Neck supple.     Comments: Patient does have normal range of motion at the right knee and is able to extend and flex it.  Patient with normal range of motion at the right hip.  Patient does complain of pain in the posterior right thigh with hip flexion and knee flexion.  No swelling noted.  No hematoma noted.  Point tenderness to the mid right thigh.  Skin:    General: Skin is warm and dry.     Capillary Refill: Capillary refill takes less than 2 seconds.  Neurological:     Mental Status: He is alert.  Psychiatric:        Mood and Affect: Mood normal.     ED Results / Procedures / Treatments   Labs (all labs ordered are listed, but only abnormal results are displayed) Labs Reviewed - No data to display  EKG None  Radiology No results found.  Procedures Procedures    Medications Ordered in ED Medications - No data to display  ED Course/ Medical Decision Making/ A&P                           Medical Decision Making  Patient presents to the hospital with a chief complaint of right thigh pain.  Differential diagnosis includes but is not limited to muscle strain, fracture, dislocation, and others  I reviewed the patient's past medical history and found no relevant visits over the past few years  There is no indication at this time for lab work for this patient.  There is no indication at this time for any acute injury for advanced imaging of the right thigh.  This seems to be muscular in nature and plain films would not provide any benefit  Clinically the patient appears to have a hamstring strain.  Plan to  discharge patient home and have him follow-up with orthopedics as needed.  Patient will be prescribed meloxicam for anti-inflammatory.  Patient may take Tylenol as needed.  Patient instructed on icing and using heat as needed on injured area.  There is no indication for admission.  Discharge home        Final Clinical Impression(s) / ED Diagnoses Final diagnoses:  Hamstring strain, right, initial encounter    Rx / DC Orders ED Discharge Orders          Ordered    meloxicam (MOBIC) 15 MG tablet  Daily        02/15/22 2137              Ronny Bacon 02/15/22 2138    Audley Hose, MD 02/16/22 1610

## 2022-02-15 NOTE — ED Triage Notes (Signed)
Patient reports right thigh muscle pain this morning while playing soccer , denies fall or injury.

## 2022-02-15 NOTE — Discharge Instructions (Addendum)
You were seen today for what appears to be a hamstring strain.  I have prescribed a medication, Aloxi cam which is an anti-inflammatory.  Do not take other NSAID medications while taking this medication.  You may still take Tylenol for pain as needed.  Do not exceed 4000 mg of Tylenol daily.  Please rest the area as you are able.  Muscle strains take time to heal.  You may ice the area over the next few days and may add heat as needed for comfort.  If this injury fails to improve over the next few weeks I do recommend following up with orthopedic surgery.  I have attached information for the on-call provider to contact as needed

## 2022-03-30 DIAGNOSIS — Y9366 Activity, soccer: Secondary | ICD-10-CM | POA: Insufficient documentation

## 2022-03-30 DIAGNOSIS — S7011XA Contusion of right thigh, initial encounter: Secondary | ICD-10-CM | POA: Insufficient documentation

## 2022-03-30 DIAGNOSIS — X58XXXA Exposure to other specified factors, initial encounter: Secondary | ICD-10-CM | POA: Insufficient documentation

## 2022-03-31 ENCOUNTER — Emergency Department (HOSPITAL_COMMUNITY)
Admission: EM | Admit: 2022-03-31 | Discharge: 2022-03-31 | Disposition: A | Discharge status: Home / Self Care | Payer: Self-pay | Attending: Emergency Medicine | Admitting: Emergency Medicine

## 2022-03-31 ENCOUNTER — Encounter (HOSPITAL_COMMUNITY): Payer: Self-pay

## 2022-03-31 ENCOUNTER — Other Ambulatory Visit: Payer: Self-pay

## 2022-03-31 DIAGNOSIS — S76301A Unspecified injury of muscle, fascia and tendon of the posterior muscle group at thigh level, right thigh, initial encounter: Secondary | ICD-10-CM

## 2022-03-31 MED ORDER — TRAMADOL HCL 50 MG PO TABS: 50.0000 mg | ORAL_TABLET | Freq: Four times a day (QID) | ORAL | 0 refills | Status: DC | PRN

## 2022-03-31 MED ORDER — HYDROCODONE-ACETAMINOPHEN 5-325 MG PO TABS
1.0000 | ORAL_TABLET | Freq: Once | ORAL | Status: AC
  Administered 2022-03-31: 1 via ORAL
  Filled 2022-03-31: qty 1

## 2022-03-31 NOTE — ED Notes (Signed)
Knee immobilizer applied, crutches given with instructions.

## 2022-03-31 NOTE — ED Notes (Signed)
Ortho tech called 

## 2022-03-31 NOTE — ED Provider Notes (Signed)
[facility] EMERGENCY DEPARTMENT Provider Note   CSN: [id number] Arrival date & time: 03/30/22  2218     History  Chief Complaint  Patient presents with   Leg Injury    Bradley Underwood is a 31 y.o. male.  31 y/o male presents for pain to R hamstring. Reports injuring it while playing soccer. Patient rested his injury for a few weeks and then resumed playing. Was playing soccer again yesterday when he felt as though he re-injured this area. He has constant pain, worse with movement and ambulation. Has tried Naproxen for pain w/o relief. He denies extremity numbness and paresthesias, inability to ambulate.  The history is provided by the patient. No language interpreter was used.       Home Medications Prior to Admission medications   Medication Sig Start Date End Date Taking? Authorizing Provider  traMADol (ULTRAM) 50 MG tablet Take 1 tablet (50 mg total) by mouth every 6 (six) hours as needed for severe pain. 03/31/22  Yes Antony Madura, PA-C      Allergies    Patient has no known allergies.    Review of Systems   Review of Systems Ten systems reviewed and are negative for acute change, except as noted in the HPI.    Physical Exam Updated Vital Signs BP 134/86 (BP Location: Right Arm)   Pulse 79   Temp 97.9 F (36.6 C) (Oral)   Resp 18   Ht 5\' 7"  (1.702 m)   Wt 93 kg   SpO2 99%   BMI 32.11 kg/m   Physical Exam Vitals and nursing note reviewed.  Constitutional:      General: He is not in acute distress.    Appearance: He is well-developed. He is not diaphoretic.     Comments: Nontoxic appearing and in NAD  HENT:     Head: Normocephalic and atraumatic.  Eyes:     General: No scleral icterus.    Conjunctiva/sclera: Conjunctivae normal.  Cardiovascular:     Rate and Rhythm: Normal rate and regular rhythm.     Pulses: Normal pulses.     Comments: DP pulse 2+ in the RLE Pulmonary:     Effort: Pulmonary effort is normal. No respiratory  distress.  Musculoskeletal:        General: Normal range of motion.     Cervical back: Normal range of motion.     Comments: Bruising to the medial aspect of the posterior R thigh. Compartments of the RLE are soft, compressible. Extremity is warm and well perfused. No crepitus.  Skin:    General: Skin is warm and dry.     Coloration: Skin is not pale.     Findings: No erythema or rash.  Neurological:     Mental Status: He is alert and oriented to person, place, and time.     Comments: 5/5 strength against resistance with R knee flexion and extension. Also 5/5 strength with R hip abduction, adduction, flexion, and extension against resistance.  Psychiatric:        Behavior: Behavior normal.     ED Results / Procedures / Treatments   Labs (all labs ordered are listed, but only abnormal results are displayed) Labs Reviewed - No data to display  EKG None  Radiology No results found.  Procedures Procedures    Medications Ordered in ED Medications  HYDROcodone-acetaminophen (NORCO/VICODIN) 5-325 MG per tablet 1 tablet (has no administration in time range)    ED Course/ Medical Decision Making/ A&P  Medical Decision Making Risk Prescription drug management.   This patient presents to the ED for concern of RLE pain, this involves an extensive number of treatment options, and is a complaint that carries with it a high risk of complications and morbidity.  The differential diagnosis includes sprain/strain vs contusion vs fx vs compartment syndrome   Co morbidities that complicate the patient evaluation  None    Additional history obtained:  Additional history obtained from partner at bedside   Cardiac Monitoring:  The patient was maintained on a cardiac monitor.  I personally viewed and interpreted the cardiac monitored which showed an underlying rhythm of: NSR   Medicines ordered and prescription drug management:  I ordered medication  including Norco for pain  Reevaluation of the patient after these medicines showed that the patient improved I have reviewed the patients home medicines and have made adjustments as needed   Test Considered:  Xray femur   Problem List / ED Course:  Patient neurovascularly intact, ambulatory No signs of compartment syndrome No clinical concern for femur fx, especially given ambulatory status Applied knee immobilizer, patient given crutches.   Reevaluation:  After the interventions noted above, I reevaluated the patient and found that they have :stayed the same   Social Determinants of Health:  Uninsured    Dispostion:  After consideration of the diagnostic results and the patients response to treatment, I feel that the patent would benefit from outpatient sports medicine f/u. OK to continue supportive care instructions. Return precautions discussed and provided. Patient discharged in stable condition with no unaddressed concerns.         Final Clinical Impression(s) / ED Diagnoses Final diagnoses:  Hamstring injury, right, initial encounter    Rx / DC Orders ED Discharge Orders          Ordered    traMADol (ULTRAM) 50 MG tablet  Every 6 hours PRN        03/31/22 0028              Antony Madura, PA-C 03/31/22 0435    Palumbo, April, MD 03/31/22 [phone]

## 2022-03-31 NOTE — ED Triage Notes (Signed)
Pt injured right hamstring while playing soccer a few weeks ago and played again yesterday and injury worsened.

## 2022-03-31 NOTE — Discharge Instructions (Addendum)
We recommend an immobilizer for stability.  Use crutches when walking to prevent from putting weight on your right leg.  Take 600 mg ibuprofen every 6 hours for management of pain.  You may use tramadol as prescribed for episodes of severe pain.  Do not drive or drink alcohol after taking this medication as it may make you drowsy and impair your judgment.  Follow-up with sports medicine for further evaluation of your injury and to ensure proper healing.

## 2022-05-01 ENCOUNTER — Ambulatory Visit (HOSPITAL_COMMUNITY): Admission: EM | Admit: 2022-05-01 | Discharge: 2022-05-01 | Discharge status: Against Medical Advice | Payer: Self-pay

## 2022-05-07 ENCOUNTER — Encounter (HOSPITAL_COMMUNITY): Payer: Self-pay

## 2022-05-07 ENCOUNTER — Ambulatory Visit (HOSPITAL_COMMUNITY)
Admission: RE | Admit: 2022-05-07 | Discharge: 2022-05-07 | Disposition: A | Discharge status: Home / Self Care | Payer: Self-pay | Source: Ambulatory Visit | Attending: Emergency Medicine | Admitting: Emergency Medicine

## 2022-05-07 VITALS — BP 146/81 | HR 88 | Temp 98.3°F | Resp 16

## 2022-05-07 DIAGNOSIS — Z202 Contact with and (suspected) exposure to infections with a predominantly sexual mode of transmission: Secondary | ICD-10-CM | POA: Insufficient documentation

## 2022-05-07 MED ORDER — DOXYCYCLINE HYCLATE 100 MG PO CAPS: 100.0000 mg | ORAL_CAPSULE | Freq: Two times a day (BID) | ORAL | 0 refills | Status: DC

## 2022-05-07 NOTE — ED Triage Notes (Signed)
Pt was expose to Chlamydia. Denies any symptoms at this time.

## 2022-05-07 NOTE — Discharge Instructions (Addendum)
We will call you if any of your test results warrant a change in your plan of care, you may view these test results on MyChart.  Doxycycline is being sent to the pharmacy, you will take this medication 2 times daily for the next 7 days.  Please make sure to take this medication with a full glass of water.    Please refrain from any sexual activity until receiving all your test results and finishing the doxycycline antibiotic.

## 2022-05-07 NOTE — ED Provider Notes (Signed)
[place]    CSN: [id number] Arrival date & time: 05/07/22  1751      History   Chief Complaint Chief Complaint  Patient presents with   Exposure to STD    HPI Bradley Underwood is a 31 y.o. male.  Patient presents due to an STD exposure. He reports that his partner tested positive for chlamydia.  He denies any penile pain, testicular pain, dysuria, penile drainage, fever, chills, changes of the skin on his private area (no rash), nausea, vomiting, or any systemic symptoms.  He reports that him and his partner do not use condoms consistently.    Exposure to STD    History reviewed. No pertinent past medical history.  There are no problems to display for this patient.   History reviewed. No pertinent surgical history.     Home Medications    Prior to Admission medications   Medication Sig Start Date End Date Taking? Authorizing Provider  doxycycline (VIBRAMYCIN) 100 MG capsule Take 1 capsule (100 mg total) by mouth 2 (two) times daily. 05/07/22  Yes Flossie Dibble, NP  traMADol (ULTRAM) 50 MG tablet Take 1 tablet (50 mg total) by mouth every 6 (six) hours as needed for severe pain. 03/31/22   Antonietta Breach, PA-C    Family History History reviewed. No pertinent family history.  Social History Social History   Tobacco Use   Smoking status: Every Day    Packs/day: 0.50    Types: Cigarettes   Smokeless tobacco: Never  Substance Use Topics   Alcohol use: No   Drug use: No     Allergies   Patient has no known allergies.   Review of Systems Review of Systems Per HPI  Physical Exam Triage Vital Signs ED Triage Vitals [05/07/22 1840]  Enc Vitals Group     BP (!) 146/81     Pulse Rate 88     Resp 16     Temp 98.3 F (36.8 C)     Temp Source Oral     SpO2 100 %     Weight      Height      Head Circumference      Peak Flow      Pain Score      Pain Loc      Pain Edu?      Excl. in [place]?    No data found.  Updated Vital Signs BP  (!) 146/81 (BP Location: Left Arm)   Pulse 88   Temp 98.3 F (36.8 C) (Oral)   Resp 16   SpO2 100%    Physical Exam Vitals and nursing note reviewed.  Constitutional:      Appearance: Normal appearance.  Genitourinary:    Comments: Deferred exam  Neurological:     Mental Status: He is alert.      UC Treatments / Results  Labs (all labs ordered are listed, but only abnormal results are displayed) Labs Reviewed  CYTOLOGY, (ORAL, ANAL, URETHRAL) ANCILLARY ONLY    EKG   Radiology No results found.  Procedures Procedures (including critical care time)  Medications Ordered in UC Medications - No data to display  Initial Impression / Assessment and Plan / UC Course  I have reviewed the triage vital signs and the nursing notes.  Pertinent labs & imaging results that were available during my care of the patient were reviewed by me and considered in my medical decision making (see chart for details).     Patient  presents due to STD exposure to chlamydia. Cytology pending.  Due to close exposure, patient was empirically treated for chlamydia.  Doxycycline antibiotic was sent to the pharmacy.  Patient was made aware of treatment regiment. Patient made aware of results reporting protocol and MyChart.  Patient verbalized understanding of instructions.   Charting was provided using a a verbal dictation system, charting was proofread for errors, errors may occur which could change the meaning of the information charted.    Final Clinical Impressions(s) / UC Diagnoses   Final diagnoses:  STD exposure     Discharge Instructions      We will call you if any of your test results warrant a change in your plan of care, you may view these test results on MyChart.  Doxycycline is being sent to the pharmacy, you will take this medication 2 times daily for the next 7 days.  Please make sure to take this medication with a full glass of water.    Please refrain from any sexual  activity until receiving all your test results and finishing the doxycycline antibiotic.     ED Prescriptions     Medication Sig Dispense Auth. Provider   doxycycline (VIBRAMYCIN) 100 MG capsule Take 1 capsule (100 mg total) by mouth 2 (two) times daily. 14 capsule Debby Freiberg, NP      PDMP not reviewed this encounter.   Debby Freiberg, NP 05/08/22 [phone]

## 2022-05-08 LAB — CYTOLOGY, (ORAL, ANAL, URETHRAL) ANCILLARY ONLY
Chlamydia: POSITIVE — AB
Comment: NEGATIVE
Comment: NEGATIVE
Comment: NORMAL
Neisseria Gonorrhea: NEGATIVE
Trichomonas: NEGATIVE

## 2022-06-08 ENCOUNTER — Ambulatory Visit (HOSPITAL_COMMUNITY)
Admission: EM | Admit: 2022-06-08 | Discharge: 2022-06-08 | Disposition: A | Discharge status: Home / Self Care | Payer: Self-pay | Attending: Family Medicine | Admitting: Family Medicine

## 2022-06-08 ENCOUNTER — Encounter (HOSPITAL_COMMUNITY): Payer: Self-pay

## 2022-06-08 DIAGNOSIS — Z20822 Contact with and (suspected) exposure to covid-19: Secondary | ICD-10-CM | POA: Insufficient documentation

## 2022-06-08 DIAGNOSIS — Z113 Encounter for screening for infections with a predominantly sexual mode of transmission: Secondary | ICD-10-CM | POA: Insufficient documentation

## 2022-06-08 LAB — HIV ANTIBODY (ROUTINE TESTING W REFLEX): HIV Screen 4th Generation wRfx: NONREACTIVE

## 2022-06-08 NOTE — ED Provider Notes (Signed)
[place]    CSN: [id number] Arrival date & time: 06/08/22  1818      History   Chief Complaint Chief Complaint  Patient presents with   Exposure to STD   Covid Exposure    HPI Bradley Underwood is a 32 y.o. male.    Exposure to STD   For STD screening.  He does not have any fever or abdominal pain or penile discharge or itching or dysuria.  He would like RPR and HIV testing also  Also he comes in stating he would like to be tested for COVID and for flu.  His girlfriend tested positive for COVID yesterday.  She did not test positive for flu.  Again patient does not have any fever or cough or aches  History reviewed. No pertinent past medical history.  There are no problems to display for this patient.   History reviewed. No pertinent surgical history.     Home Medications    Prior to Admission medications   Not on File    Family History History reviewed. No pertinent family history.  Social History Social History   Tobacco Use   Smoking status: Every Day    Packs/day: 0.50    Types: Cigarettes   Smokeless tobacco: Never  Substance Use Topics   Alcohol use: No   Drug use: No     Allergies   Patient has no known allergies.   Review of Systems Review of Systems   Physical Exam Triage Vital Signs ED Triage Vitals  Enc Vitals Group     BP 06/08/22 2001 124/77     Pulse Rate 06/08/22 2001 76     Resp 06/08/22 2001 18     Temp 06/08/22 2001 98 F (36.7 C)     Temp Source 06/08/22 2001 Oral     SpO2 06/08/22 2001 98 %     Weight --      Height --      Head Circumference --      Peak Flow --      Pain Score 06/08/22 2000 0     Pain Loc --      Pain Edu? --      Excl. in [place]? --    No data found.  Updated Vital Signs BP 124/77 (BP Location: Left Arm)   Pulse 76   Temp 98 F (36.7 C) (Oral)   Resp 18   SpO2 98%   Visual Acuity Right Eye Distance:   Left Eye Distance:   Bilateral Distance:    Right Eye Near:   Left  Eye Near:    Bilateral Near:     Physical Exam Vitals reviewed.  Constitutional:      General: He is not in acute distress.    Appearance: He is not toxic-appearing.  HENT:     Nose: Nose normal.     Mouth/Throat:     Mouth: Mucous membranes are moist.  Eyes:     Extraocular Movements: Extraocular movements intact.     Conjunctiva/sclera: Conjunctivae normal.     Pupils: Pupils are equal, round, and reactive to light.  Cardiovascular:     Rate and Rhythm: Normal rate and regular rhythm.     Heart sounds: No murmur heard. Pulmonary:     Effort: Pulmonary effort is normal.     Breath sounds: Normal breath sounds.  Musculoskeletal:     Cervical back: Neck supple.  Lymphadenopathy:     Cervical: No cervical adenopathy.  Skin:  Coloration: Skin is not jaundiced or pale.  Neurological:     General: No focal deficit present.     Mental Status: He is alert and oriented to person, place, and time.  Psychiatric:        Behavior: Behavior normal.      UC Treatments / Results  Labs (all labs ordered are listed, but only abnormal results are displayed) Labs Reviewed  SARS CORONAVIRUS 2 (TAT 6-24 HRS)  RPR  HIV ANTIBODY (ROUTINE TESTING W REFLEX)  CYTOLOGY, (ORAL, ANAL, URETHRAL) ANCILLARY ONLY    EKG   Radiology No results found.  Procedures Procedures (including critical care time)  Medications Ordered in UC Medications - No data to display  Initial Impression / Assessment and Plan / UC Course  I have reviewed the triage vital signs and the nursing notes.  Pertinent labs & imaging results that were available during my care of the patient were reviewed by me and considered in my medical decision making (see chart for details).        Self swab is done along with HIV and RPR.  Staff will notify him of any positives and treat per protocol.  COVID swab was done, so he will know if he needs to quarantine.  I discussed with him that he does not have any  indications for a flu test. Final Clinical Impressions(s) / UC Diagnoses   Final diagnoses:  Exposure to COVID-19 virus  Screening for STD (sexually transmitted disease)     Discharge Instructions      Staff will notify you if there is anything positive on your swab or blood work   You have been swabbed for COVID, and the test will result in the next 24 hours. Our staff will call you if positive. If the COVID test is positive, you should quarantine for 5 days from the start of your symptoms.  On days 6-10 from the start of your illness, you should wear a mask if out in public.      ED Prescriptions   None    PDMP not reviewed this encounter.   Barrett Henle, MD 06/08/22 2022

## 2022-06-08 NOTE — Discharge Instructions (Signed)
Staff will notify you if there is anything positive on your swab or blood work   You have been swabbed for COVID, and the test will result in the next 24 hours. Our staff will call you if positive. If the COVID test is positive, you should quarantine for 5 days from the start of your symptoms.  On days 6-10 from the start of your illness, you should wear a mask if out in public.

## 2022-06-08 NOTE — ED Triage Notes (Signed)
Pt is here for STD exposure as a preventive, Covid testing due to girlfriend testing positive yesterday pt is having no symptoms. Pt also wants to be tested for flu no symptoms

## 2022-06-09 LAB — CYTOLOGY, (ORAL, ANAL, URETHRAL) ANCILLARY ONLY
Chlamydia: NEGATIVE
Comment: NEGATIVE
Comment: NEGATIVE
Comment: NORMAL
Neisseria Gonorrhea: NEGATIVE
Trichomonas: NEGATIVE

## 2022-06-09 LAB — RPR: RPR Ser Ql: NONREACTIVE

## 2022-06-09 LAB — SARS CORONAVIRUS 2 (TAT 6-24 HRS): SARS Coronavirus 2: NEGATIVE

## 2023-08-03 ENCOUNTER — Ambulatory Visit
Admission: EM | Admit: 2023-08-03 | Discharge: 2023-08-03 | Disposition: A | Discharge status: Home / Self Care | Payer: Self-pay | Attending: Family Medicine | Admitting: Family Medicine

## 2023-08-03 DIAGNOSIS — M62838 Other muscle spasm: Secondary | ICD-10-CM

## 2023-08-03 MED ORDER — CYCLOBENZAPRINE HCL 10 MG PO TABS: 10.0000 mg | ORAL_TABLET | Freq: Two times a day (BID) | ORAL | 0 refills | Status: AC | PRN

## 2023-08-03 MED ORDER — KETOROLAC TROMETHAMINE 30 MG/ML IJ SOLN
30.0000 mg | Freq: Once | INTRAMUSCULAR | Status: AC
  Administered 2023-08-03: 30 mg via INTRAMUSCULAR

## 2023-08-03 NOTE — Discharge Instructions (Addendum)
 You were given a Toradol injection in clinic today. Do not take any over the counter NSAID's such as Advil, ibuprofen, Aleve, or naproxen for 24 hours. You may take tylenol if needed. You may take flexeril as needed. Please note this mediation will make you drowsy. Do not drink alcohol or drive while on this medication. Heat to the affected area and rest.  Please follow-up with your PCP if your symptoms do not improve.  Please go to the ER for any worsening symptoms.  Hope you feel better soon!

## 2023-08-03 NOTE — ED Provider Notes (Addendum)
 [facility]    CSN: [id number] Arrival date & time: 08/03/23  0831      History   Chief Complaint Chief Complaint  Patient presents with   Shoulder Pain    HPI Bradley Underwood is a 33 y.o. male presents for evaluation of shoulder pain.  Patient reports he woke around 4-5 AM this morning with a intermittent left posterior/lateral shoulder pain that he describes as a sharp/spasming pain that is worse with movement.  Denies any numbness/tingling/weakness of his upper extremities, no neck pain or mid back pain.  No chest pain or shortness of breath.  Denies any known injury or inciting event.  He took 200 mg of ibuprofen and used some IcyHot without improvement.  No history of injuries or surgeries to the left shoulder in the past.  No other concerns at this time.   Shoulder Pain   History reviewed. No pertinent past medical history.  There are no active problems to display for this patient.   History reviewed. No pertinent surgical history.     Home Medications    Prior to Admission medications   Medication Sig Start Date End Date Taking? Authorizing Provider  cyclobenzaprine (FLEXERIL) 10 MG tablet Take 1 tablet (10 mg total) by mouth 2 (two) times daily as needed for muscle spasms. 08/03/23  Yes Radford Pax, NP    Family History History reviewed. No pertinent family history.  Social History Social History   Tobacco Use   Smoking status: Every Day    Current packs/day: 0.50    Types: Cigarettes   Smokeless tobacco: Never  Substance Use Topics   Alcohol use: No   Drug use: No     Allergies   Patient has no known allergies.   Review of Systems Review of Systems  Musculoskeletal:        Left shoulder pain     Physical Exam Triage Vital Signs ED Triage Vitals  Encounter Vitals Group     BP 08/03/23 0840 (!) 143/98     Systolic BP Percentile --      Diastolic BP Percentile --      Pulse Rate 08/03/23 0840 83     Resp 08/03/23 0840 17      Temp 08/03/23 0840 98.1 F (36.7 C)     Temp Source 08/03/23 0840 Oral     SpO2 08/03/23 0840 97 %     Weight --      Height --      Head Circumference --      Peak Flow --      Pain Score 08/03/23 0838 8     Pain Loc --      Pain Education --      Exclude from Growth Chart --    No data found.  Updated Vital Signs BP (!) 143/98 (BP Location: Right Arm)   Pulse 83   Temp 98.1 F (36.7 C) (Oral)   Resp 17   SpO2 97%   Visual Acuity Right Eye Distance:   Left Eye Distance:   Bilateral Distance:    Right Eye Near:   Left Eye Near:    Bilateral Near:     Physical Exam Vitals and nursing note reviewed.  Constitutional:      General: He is not in acute distress.    Appearance: Normal appearance. He is not ill-appearing.  HENT:     Head: Normocephalic and atraumatic.  Eyes:     Pupils: Pupils are equal, round, and  reactive to light.  Cardiovascular:     Rate and Rhythm: Normal rate.  Pulmonary:     Effort: Pulmonary effort is normal.  Musculoskeletal:     Left shoulder: Tenderness present. No swelling, deformity, effusion, laceration, bony tenderness or crepitus. Decreased range of motion. Normal strength. Normal pulse.       Arms:     Cervical back: Full passive range of motion without pain, normal range of motion and neck supple. No spinous process tenderness or muscular tenderness.     Comments: Tenderness without swelling or ecchymosis along the left trapezius muscle that extends to the left lateral shoulder/deltoid.  Positive Leanord Asal test.  Strength is 5 out of 5 bilateral upper extremities.  Skin:    General: Skin is warm and dry.  Neurological:     General: No focal deficit present.     Mental Status: He is alert and oriented to person, place, and time.  Psychiatric:        Mood and Affect: Mood normal.        Behavior: Behavior normal.      UC Treatments / Results  Labs (all labs ordered are listed, but only abnormal results are  displayed) Labs Reviewed - No data to display  Basic metabolic panel Order: [id number]  Status: Final result     Next appt: None   Test Result Released: Yes (seen)   0 Result Notes      Component Ref Range & Units (hover) 2 yr ago (08/02/21) 2 yr ago (08/01/21) 6 yr ago (08/27/16)  Sodium 135 137 137  Potassium 4.0 4.1 3.7  Chloride 99 100 103 R  CO2 25 25 23   Glucose, Bld 102 High  113 High  CM 98 R  Comment: Glucose reference range applies only to samples taken after fasting for at least 8 hours.  BUN 11 16 14   Creatinine, Ser 0.70 0.88 0.73  Calcium 9.1 9.5 9.7  GFR, Estimated >60 >60 CM   Comment: (NOTE) Calculated using the CKD-EPI Creatinine Equation (2021)  Anion gap 11 12 CM 11  Comment: Performed at [facility], [address] [place], [place] [address]  Resulting Agency [facility]        Specimen Collected: 08/02/21 13:07 Last Resulted: 08/02/21 13:56       EKG   Radiology No results found.  Procedures Procedures (including critical care time)  Medications Ordered in UC Medications  ketorolac (TORADOL) 30 MG/ML injection 30 mg (30 mg Intramuscular Given 08/03/23 0857)    Initial Impression / Assessment and Plan / UC Course  I have reviewed the triage vital signs and the nursing notes.  Pertinent labs & imaging results that were available during my care of the patient were reviewed by me and considered in my medical decision making (see chart for details).     Reviewed exam and symptoms with patient.  No red flags.  Discussed muscle spasm/strain as cause of symptoms.  Patient was given Toradol injection in clinic.  He was monitored for 10 minutes after injection with no reaction noted and tolerated well.  Instructed no NSAIDs for 24 hours and he verbalized understanding.  Will do trial of Flexeril, side effect profile reviewed.  Discussed heat and rest.  PCP follow-up if symptoms do not improve.  ER precautions reviewed  and patient verbalized understanding. Final Clinical Impressions(s) / UC Diagnoses   Final diagnoses:  Muscle spasm of left shoulder  Discharge Instructions      You were given a Toradol injection in clinic today. Do not take any over the counter NSAID's such as Advil, ibuprofen, Aleve, or naproxen for 24 hours. You may take tylenol if needed. You may take flexeril as needed. Please note this mediation will make you drowsy. Do not drink alcohol or drive while on this medication. Heat to the affected area and rest.  Please follow-up with your PCP if your symptoms do not improve.  Please go to the ER for any worsening symptoms.  Hope you feel better soon!      ED Prescriptions     Medication Sig Dispense Auth. Provider   cyclobenzaprine (FLEXERIL) 10 MG tablet Take 1 tablet (10 mg total) by mouth 2 (two) times daily as needed for muscle spasms. 10 tablet Radford Pax, NP      PDMP not reviewed this encounter.   Radford Pax, NP 08/03/23 0857    Radford Pax, NP 08/03/23 [phone]

## 2023-08-03 NOTE — ED Triage Notes (Signed)
 Pt presents with c/o lt sharp pain on the rt shoulder since 5 this morning. States he took ibuprofen.
